# Patient Record
Sex: Female | Born: 1940 | Race: White | Hispanic: No | Marital: Married | Smoking: Former smoker
Health system: Southern US, Community
[De-identification: ages and names within clinical notes are randomized; demographics above are authoritative.]

## PROBLEM LIST (undated history)

## (undated) DIAGNOSIS — H3530 Unspecified macular degeneration: Secondary | ICD-10-CM

## (undated) DIAGNOSIS — Z923 Personal history of irradiation: Secondary | ICD-10-CM

## (undated) DIAGNOSIS — C7931 Secondary malignant neoplasm of brain: Secondary | ICD-10-CM

## (undated) DIAGNOSIS — I10 Essential (primary) hypertension: Secondary | ICD-10-CM

## (undated) DIAGNOSIS — C649 Malignant neoplasm of unspecified kidney, except renal pelvis: Secondary | ICD-10-CM

## (undated) DIAGNOSIS — J45909 Unspecified asthma, uncomplicated: Secondary | ICD-10-CM

## (undated) HISTORY — DX: Personal history of irradiation: Z92.3

## (undated) HISTORY — DX: Malignant neoplasm of unspecified kidney, except renal pelvis: C64.9

## (undated) HISTORY — DX: Secondary malignant neoplasm of brain: C79.31

---

## 2003-05-02 ENCOUNTER — Ambulatory Visit (HOSPITAL_COMMUNITY): Admission: RE | Admit: 2003-05-02 | Discharge: 2003-05-02 | Payer: Self-pay | Admitting: *Deleted

## 2003-11-05 ENCOUNTER — Ambulatory Visit (HOSPITAL_COMMUNITY): Admission: RE | Admit: 2003-11-05 | Discharge: 2003-11-05 | Payer: Self-pay | Admitting: Ophthalmology

## 2003-11-12 ENCOUNTER — Ambulatory Visit (HOSPITAL_COMMUNITY): Admission: RE | Admit: 2003-11-12 | Discharge: 2003-11-12 | Payer: Self-pay | Admitting: Ophthalmology

## 2010-11-25 ENCOUNTER — Encounter: Payer: Self-pay | Admitting: Gastroenterology

## 2012-06-05 ENCOUNTER — Encounter (HOSPITAL_COMMUNITY): Payer: Self-pay | Admitting: *Deleted

## 2012-06-05 ENCOUNTER — Emergency Department (HOSPITAL_COMMUNITY): Payer: Medicare Other

## 2012-06-05 ENCOUNTER — Inpatient Hospital Stay (HOSPITAL_COMMUNITY)
Admission: EM | Admit: 2012-06-05 | Discharge: 2012-06-11 | DRG: 025 | Disposition: A | Discharge status: Home Health | Payer: Medicare Other | Attending: Internal Medicine | Admitting: Internal Medicine

## 2012-06-05 DIAGNOSIS — H3530 Unspecified macular degeneration: Secondary | ICD-10-CM | POA: Diagnosis present

## 2012-06-05 DIAGNOSIS — R262 Difficulty in walking, not elsewhere classified: Secondary | ICD-10-CM | POA: Diagnosis present

## 2012-06-05 DIAGNOSIS — I10 Essential (primary) hypertension: Secondary | ICD-10-CM | POA: Diagnosis present

## 2012-06-05 DIAGNOSIS — N2889 Other specified disorders of kidney and ureter: Secondary | ICD-10-CM | POA: Diagnosis present

## 2012-06-05 DIAGNOSIS — G936 Cerebral edema: Secondary | ICD-10-CM | POA: Diagnosis present

## 2012-06-05 DIAGNOSIS — C7800 Secondary malignant neoplasm of unspecified lung: Secondary | ICD-10-CM | POA: Diagnosis present

## 2012-06-05 DIAGNOSIS — R4789 Other speech disturbances: Secondary | ICD-10-CM | POA: Diagnosis present

## 2012-06-05 DIAGNOSIS — C7970 Secondary malignant neoplasm of unspecified adrenal gland: Secondary | ICD-10-CM | POA: Diagnosis present

## 2012-06-05 DIAGNOSIS — F17200 Nicotine dependence, unspecified, uncomplicated: Secondary | ICD-10-CM | POA: Diagnosis present

## 2012-06-05 DIAGNOSIS — R030 Elevated blood-pressure reading, without diagnosis of hypertension: Secondary | ICD-10-CM

## 2012-06-05 DIAGNOSIS — C7931 Secondary malignant neoplasm of brain: Principal | ICD-10-CM | POA: Diagnosis present

## 2012-06-05 DIAGNOSIS — C649 Malignant neoplasm of unspecified kidney, except renal pelvis: Secondary | ICD-10-CM | POA: Diagnosis present

## 2012-06-05 DIAGNOSIS — C799 Secondary malignant neoplasm of unspecified site: Secondary | ICD-10-CM | POA: Diagnosis present

## 2012-06-05 DIAGNOSIS — D496 Neoplasm of unspecified behavior of brain: Secondary | ICD-10-CM

## 2012-06-05 HISTORY — DX: Unspecified macular degeneration: H35.30

## 2012-06-05 LAB — POCT I-STAT, CHEM 8
Calcium, Ion: 1.12 mmol/L — ABNORMAL LOW (ref 1.13–1.30)
Chloride: 108 mEq/L (ref 96–112)
HCT: 44 % (ref 36.0–46.0)
Potassium: 3.9 mEq/L (ref 3.5–5.1)
Sodium: 143 mEq/L (ref 135–145)
TCO2: 27 mmol/L (ref 0–100)

## 2012-06-05 LAB — CBC WITH DIFFERENTIAL/PLATELET
Basophils Relative: 0 % (ref 0–1)
Basophils Relative: 0 % (ref 0–1)
Eosinophils Relative: 2 % (ref 0–5)
HCT: 43.3 % (ref 36.0–46.0)
Hemoglobin: 14.6 g/dL (ref 12.0–15.0)
Lymphocytes Relative: 33 % (ref 12–46)
Lymphs Abs: 2 10*3/uL (ref 0.7–4.0)
Lymphs Abs: 2.4 10*3/uL (ref 0.7–4.0)
MCH: 29.6 pg (ref 26.0–34.0)
MCHC: 33.7 g/dL (ref 30.0–36.0)
MCHC: 33.6 g/dL (ref 30.0–36.0)
MCV: 87.7 fL (ref 78.0–100.0)
Monocytes Absolute: 0.6 10*3/uL (ref 0.1–1.0)
Monocytes Relative: 8 % (ref 3–12)
Monocytes Relative: 9 % (ref 3–12)
Neutro Abs: 4.6 10*3/uL (ref 1.7–7.7)
Neutro Abs: 4 10*3/uL (ref 1.7–7.7)
Neutrophils Relative %: 62 % (ref 43–77)
Platelets: 236 10*3/uL (ref 150–400)
RBC: 4.94 MIL/uL (ref 3.87–5.11)
RDW: 13.3 % (ref 11.5–15.5)

## 2012-06-05 LAB — COMPREHENSIVE METABOLIC PANEL
ALT: 10 U/L (ref 0–35)
AST: 15 U/L (ref 0–37)
Calcium: 9.4 mg/dL (ref 8.4–10.5)
Glucose, Bld: 102 mg/dL — ABNORMAL HIGH (ref 70–99)
Sodium: 139 mEq/L (ref 135–145)
Total Bilirubin: 0.3 mg/dL (ref 0.3–1.2)
Total Protein: 7.7 g/dL (ref 6.0–8.3)

## 2012-06-05 LAB — TROPONIN I: Troponin I: <0.3 ng/mL (ref <0.30)

## 2012-06-05 MED ORDER — HEPARIN SODIUM (PORCINE) 5000 UNIT/ML IJ SOLN
5000.0000 [IU] | Freq: Three times a day (TID) | INTRAMUSCULAR | Status: DC
  Filled 2012-06-05 (×2): qty 1

## 2012-06-05 MED ORDER — DEXAMETHASONE SODIUM PHOSPHATE 10 MG/ML IJ SOLN
10.0000 mg | Freq: Four times a day (QID) | INTRAMUSCULAR | Status: DC
  Administered 2012-06-06 – 2012-06-07 (×7): 10 mg via INTRAVENOUS
  Filled 2012-06-05 (×10): qty 1

## 2012-06-05 MED ORDER — GADOBENATE DIMEGLUMINE 529 MG/ML IV SOLN
13.0000 mL | Freq: Once | INTRAVENOUS | Status: AC | PRN
  Administered 2012-06-05: 13 mL via INTRAVENOUS

## 2012-06-05 MED ORDER — ONDANSETRON HCL 4 MG/2ML IJ SOLN: 4.0000 mg | Freq: Three times a day (TID) | INTRAMUSCULAR | Status: AC | PRN

## 2012-06-05 MED ORDER — DEXAMETHASONE SODIUM PHOSPHATE 10 MG/ML IJ SOLN
10.0000 mg | Freq: Once | INTRAMUSCULAR | Status: AC
  Administered 2012-06-05: 10 mg via INTRAVENOUS
  Filled 2012-06-05: qty 1

## 2012-06-05 MED ORDER — LABETALOL HCL 5 MG/ML IV SOLN: 5.0000 mg | INTRAVENOUS | Status: DC | PRN

## 2012-06-05 NOTE — ED Notes (Signed)
Pt's family denies hearing slurred speech at this moment, reports now that they think about it, it has been more intermittent.
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
## 2012-06-05 NOTE — ED Notes (Signed)
The pt has had some lower extremity swelling  Intermittently with some difficulty walking since January.  According to her son who is with her the pt has also had some slurred speech in the past 7 days.  At present her speech appears to be  clear

## 2012-06-05 NOTE — ED Notes (Signed)
Pt back from radiology 
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
## 2012-06-05 NOTE — ED Notes (Signed)
Pt's left leg slightly swollen, warm to touch and red around ankle. Pt denies pain to left leg/ankle/foot.

## 2012-06-05 NOTE — ED Notes (Signed)
Pt in CT. Called to ask them to bring her to E7 when finished. Family brought to room.
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
## 2012-06-05 NOTE — ED Notes (Signed)
Pt returned from radiology.

## 2012-06-06 ENCOUNTER — Encounter (HOSPITAL_COMMUNITY): Payer: Self-pay

## 2012-06-06 ENCOUNTER — Inpatient Hospital Stay (HOSPITAL_COMMUNITY): Payer: Medicare Other

## 2012-06-06 ENCOUNTER — Other Ambulatory Visit: Payer: Self-pay | Admitting: Neurological Surgery

## 2012-06-06 DIAGNOSIS — M7989 Other specified soft tissue disorders: Secondary | ICD-10-CM

## 2012-06-06 LAB — CBC
HCT: 44.4 % (ref 36.0–46.0)
MCV: 87.2 fL (ref 78.0–100.0)
RDW: 13.2 % (ref 11.5–15.5)
WBC: 3.9 10*3/uL — ABNORMAL LOW (ref 4.0–10.5)

## 2012-06-06 LAB — BASIC METABOLIC PANEL
BUN: 13 mg/dL (ref 6–23)
Chloride: 103 mEq/L (ref 96–112)
Creatinine, Ser: 0.5 mg/dL (ref 0.50–1.10)
GFR calc Af Amer: >90 mL/min (ref >90)
Glucose, Bld: 171 mg/dL — ABNORMAL HIGH (ref 70–99)

## 2012-06-06 LAB — GLUCOSE, CAPILLARY

## 2012-06-06 MED ORDER — ZOLPIDEM TARTRATE 5 MG PO TABS: 5.0000 mg | ORAL_TABLET | Freq: Every evening | ORAL | Status: DC | PRN

## 2012-06-06 MED ORDER — WHITE PETROLATUM GEL
Status: AC
  Administered 2012-06-06: 13:00:00
  Filled 2012-06-06: qty 5

## 2012-06-06 MED ORDER — IOHEXOL 300 MG/ML  SOLN
100.0000 mL | Freq: Once | INTRAMUSCULAR | Status: AC | PRN
  Administered 2012-06-06: 100 mL via INTRAVENOUS

## 2012-06-06 MED ORDER — IOHEXOL 300 MG/ML  SOLN: 25.0000 mL | INTRAMUSCULAR | Status: AC

## 2012-06-06 MED ORDER — MORPHINE SULFATE 4 MG/ML IJ SOLN
4.0000 mg | INTRAMUSCULAR | Status: DC | PRN
  Administered 2012-06-06 – 2012-06-07 (×2): 4 mg via INTRAVENOUS
  Filled 2012-06-06 (×2): qty 1

## 2012-06-06 MED ORDER — IOHEXOL 300 MG/ML  SOLN
25.0000 mL | INTRAMUSCULAR | Status: AC
  Administered 2012-06-06 (×2): 25 mL via ORAL
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
## 2012-06-06 NOTE — Progress Notes (Signed)
Utilization review completed. Charlotta Lapaglia, RN, BSN. 

## 2012-06-06 NOTE — Progress Notes (Signed)
Patient ID: Sue Ray, female   DOB: [birth year], 72 y.o.   MRN: [id number] Scans are still pending. Med other members of the patien. Hopeful for surgery tomorrow afternoon about 4 PM.t's family

## 2012-06-06 NOTE — Progress Notes (Signed)
Patient ID: Sue Ray, female   DOB: 1940-11-19, 72 y.o.   MRN: [id number] Patient is feeling better on steroids.  Physical exam shows no evidence of a drift. Family all remarks the patient's speech is significantly improved.  I reviewed the CT scan of the abdomen and it notes a large right renal mass. This is likely the primary and is consistent with the vascularity of the lesion seen in the brain on CT scan. Patient may also have some small pulmonary nodules. I would await the official report of the CT of the abdomen.  I discussed the findings with the patient and her family and answer questions as best possible and noted that the patient will need oncology consultation for further treatment of the renal primary. Nonetheless I feel that surgical resection of the singular met in her brain is important is her next and treatment. We'll proceed with surgery tomorrow afternoon.

## 2012-06-06 NOTE — Clinical Social Work Psychosocial (Signed)
Clinical Social Work Department BRIEF PSYCHOSOCIAL ASSESSMENT 06/06/2012  Patient:  Sue Ray, Sue Ray     Account Number:  [id number]     Admit date:  06/05/2012  Clinical Social Worker:  Peggyann Shoals  Date/Time:  06/06/2012 04:33 PM  Referred by:  Physician  Date Referred:  06/06/2012 Referred for  Advanced Directives   Other Referral:   Interview type:  Family Other interview type:    PSYCHOSOCIAL DATA Living Status:  ALONE Admitted from facility:   Level of care:   Primary support name:  Rosalyn Charters Primary support relationship to patient:  CHILD, ADULT Degree of support available:   Supportive.    CURRENT CONCERNS Current Concerns  Post-Acute Placement   Other Concerns:    SOCIAL WORK ASSESSMENT / PLAN CSW met with pt to address consult. CSW introduced herself and explained role of social work. CSW provided information for Advanced Directives. Pt shared that would read it over and discuss it with her family. CSW will follow up on Advanced Directives.    Per chart review, pt is for surgery tomorrow. PT/OT evals are pending for discharge recommendations. CSW will continue to follow.   Assessment/plan status:  Psychosocial Support/Ongoing Assessment of Needs Other assessment/ plan:   Information/referral to community resources:   Designer, industrial/product.    PATIENT'S/FAMILY'S RESPONSE TO PLAN OF CARE: Pt was pleasant and would like to review Advanced Directives.   Dede Query, MSW, LCSW [phone]

## 2012-06-06 NOTE — Progress Notes (Signed)
VASCULAR LAB PRELIMINARY  PRELIMINARY  PRELIMINARY  PRELIMINARY  Left lower extremity venous Doppler completed.    Preliminary report:  There is no DVT or SVT noted in the left lower extremity.  Yordin Rhoda, RVT 06/06/2012, 9:17 AM

## 2012-06-06 NOTE — Progress Notes (Signed)
Radiology called to request that all of patient's CT's happen tomorrow as they only do the stealth protocols on day shift during the week, and if patient were to have Chest and Abd CT tonight they would have to wait 24 hours to give more contrast. NP, Claiborne Billings notified and he stated that would be fine. Will continue to monitor.

## 2012-06-07 ENCOUNTER — Encounter (HOSPITAL_COMMUNITY): Payer: Self-pay | Admitting: Anesthesiology

## 2012-06-07 ENCOUNTER — Inpatient Hospital Stay (HOSPITAL_COMMUNITY): Payer: Medicare Other | Admitting: Anesthesiology

## 2012-06-07 ENCOUNTER — Encounter (HOSPITAL_COMMUNITY)
Admission: EM | Disposition: A | Discharge status: Home Health | Payer: Self-pay | Source: Home / Self Care | Attending: Internal Medicine

## 2012-06-07 DIAGNOSIS — C799 Secondary malignant neoplasm of unspecified site: Secondary | ICD-10-CM | POA: Diagnosis present

## 2012-06-07 DIAGNOSIS — N2889 Other specified disorders of kidney and ureter: Secondary | ICD-10-CM | POA: Diagnosis present

## 2012-06-07 HISTORY — PX: CRANIOTOMY: SHX93

## 2012-06-07 LAB — SURGICAL PCR SCREEN: MRSA, PCR: NEGATIVE

## 2012-06-07 LAB — GLUCOSE, CAPILLARY: Glucose-Capillary: 151 mg/dL — ABNORMAL HIGH (ref 70–99)

## 2012-06-07 SURGERY — CRANIOTOMY TUMOR EXCISION
Anesthesia: General | Site: Head | Laterality: Right | Wound class: Clean

## 2012-06-07 MED ORDER — DEXAMETHASONE SODIUM PHOSPHATE 10 MG/ML IJ SOLN
INTRAMUSCULAR | Status: DC | PRN
  Administered 2012-06-07: 10 mg via INTRAVENOUS

## 2012-06-07 MED ORDER — LABETALOL HCL 5 MG/ML IV SOLN: 10.0000 mg | INTRAVENOUS | Status: DC | PRN

## 2012-06-07 MED ORDER — PROMETHAZINE HCL 25 MG PO TABS: 12.5000 mg | ORAL_TABLET | ORAL | Status: DC | PRN

## 2012-06-07 MED ORDER — HYDROCODONE-ACETAMINOPHEN 5-325 MG PO TABS: 1.0000 | ORAL_TABLET | ORAL | Status: DC | PRN

## 2012-06-07 MED ORDER — OXYCODONE HCL 5 MG PO TABS: 5.0000 mg | ORAL_TABLET | Freq: Once | ORAL | Status: AC | PRN

## 2012-06-07 MED ORDER — OXYCODONE HCL 5 MG/5ML PO SOLN
5.0000 mg | Freq: Once | ORAL | Status: AC | PRN
Start: 2012-06-07 — End: 2012-06-07

## 2012-06-07 MED ORDER — BUPIVACAINE HCL (PF) 0.5 % IJ SOLN
INTRAMUSCULAR | Status: DC | PRN
  Administered 2012-06-07: 8 mL

## 2012-06-07 MED ORDER — LIDOCAINE-EPINEPHRINE 1 %-1:100000 IJ SOLN
INTRAMUSCULAR | Status: DC | PRN
  Administered 2012-06-07: 8 mL

## 2012-06-07 MED ORDER — MIDAZOLAM HCL 5 MG/5ML IJ SOLN
INTRAMUSCULAR | Status: DC | PRN
  Administered 2012-06-07: 1 mg via INTRAVENOUS

## 2012-06-07 MED ORDER — ROCURONIUM BROMIDE 100 MG/10ML IV SOLN
INTRAVENOUS | Status: DC | PRN
  Administered 2012-06-07: 20 mg via INTRAVENOUS
  Administered 2012-06-07: 50 mg via INTRAVENOUS

## 2012-06-07 MED ORDER — SODIUM CHLORIDE 0.9 % IR SOLN
Status: DC | PRN
  Administered 2012-06-07: 17:00:00

## 2012-06-07 MED ORDER — GLYCOPYRROLATE 0.2 MG/ML IJ SOLN
INTRAMUSCULAR | Status: DC | PRN
  Administered 2012-06-07: 0.6 mg via INTRAVENOUS

## 2012-06-07 MED ORDER — HYDROMORPHONE HCL PF 1 MG/ML IJ SOLN
0.2500 mg | INTRAMUSCULAR | Status: DC | PRN
  Administered 2012-06-07 (×2): 0.5 mg via INTRAVENOUS

## 2012-06-07 MED ORDER — ONDANSETRON HCL 4 MG PO TABS: 4.0000 mg | ORAL_TABLET | ORAL | Status: DC | PRN

## 2012-06-07 MED ORDER — SODIUM CHLORIDE 0.9 % IV SOLN
500.0000 mg | Freq: Two times a day (BID) | INTRAVENOUS | Status: DC
  Administered 2012-06-07 – 2012-06-11 (×8): 500 mg via INTRAVENOUS
  Filled 2012-06-07 (×11): qty 5

## 2012-06-07 MED ORDER — 0.9 % SODIUM CHLORIDE (POUR BTL) OPTIME
TOPICAL | Status: DC | PRN
  Administered 2012-06-07 (×2): 1000 mL

## 2012-06-07 MED ORDER — VANCOMYCIN HCL 1000 MG IV SOLR
1000.0000 mg | INTRAVENOUS | Status: DC | PRN
  Administered 2012-06-07: 16:00:00 via INTRAVENOUS

## 2012-06-07 MED ORDER — NEOSTIGMINE METHYLSULFATE 1 MG/ML IJ SOLN
INTRAMUSCULAR | Status: DC | PRN
  Administered 2012-06-07: 4 mg via INTRAVENOUS

## 2012-06-07 MED ORDER — MICROFIBRILLAR COLL HEMOSTAT EX PADS
MEDICATED_PAD | CUTANEOUS | Status: DC | PRN
  Administered 2012-06-07: 1 via TOPICAL

## 2012-06-07 MED ORDER — ONDANSETRON HCL 4 MG/2ML IJ SOLN: 4.0000 mg | Freq: Once | INTRAMUSCULAR | Status: AC | PRN

## 2012-06-07 MED ORDER — FENTANYL CITRATE 0.05 MG/ML IJ SOLN
INTRAMUSCULAR | Status: DC | PRN
  Administered 2012-06-07: 200 ug via INTRAVENOUS
  Administered 2012-06-07: 50 ug via INTRAVENOUS

## 2012-06-07 MED ORDER — DEXAMETHASONE SODIUM PHOSPHATE 10 MG/ML IJ SOLN
6.0000 mg | Freq: Four times a day (QID) | INTRAMUSCULAR | Status: AC
  Administered 2012-06-08 (×4): 6 mg via INTRAVENOUS
  Filled 2012-06-07 (×4): qty 0.6

## 2012-06-07 MED ORDER — MEPERIDINE HCL 25 MG/ML IJ SOLN: 6.2500 mg | INTRAMUSCULAR | Status: DC | PRN

## 2012-06-07 MED ORDER — DEXAMETHASONE SODIUM PHOSPHATE 4 MG/ML IJ SOLN
4.0000 mg | Freq: Four times a day (QID) | INTRAMUSCULAR | Status: AC
  Administered 2012-06-09 (×4): 4 mg via INTRAVENOUS
  Filled 2012-06-07 (×5): qty 1

## 2012-06-07 MED ORDER — LIDOCAINE HCL 4 % MT SOLN
OROMUCOSAL | Status: DC | PRN
  Administered 2012-06-07: 4 mL via TOPICAL

## 2012-06-07 MED ORDER — CEFAZOLIN SODIUM-DEXTROSE 2-3 GM-% IV SOLR
INTRAVENOUS | Status: DC | PRN
  Administered 2012-06-07: 2 g via INTRAVENOUS

## 2012-06-07 MED ORDER — THROMBIN 20000 UNITS EX SOLR
CUTANEOUS | Status: DC | PRN
  Administered 2012-06-07: 17:00:00 via TOPICAL

## 2012-06-07 MED ORDER — SODIUM CHLORIDE 0.9 % IV SOLN
INTRAVENOUS | Status: DC | PRN
  Administered 2012-06-07 (×2): via INTRAVENOUS

## 2012-06-07 MED ORDER — DEXAMETHASONE SODIUM PHOSPHATE 4 MG/ML IJ SOLN
4.0000 mg | Freq: Three times a day (TID) | INTRAMUSCULAR | Status: DC
  Administered 2012-06-10 – 2012-06-11 (×4): 4 mg via INTRAVENOUS
  Filled 2012-06-07 (×6): qty 1

## 2012-06-07 MED ORDER — LIDOCAINE HCL (CARDIAC) 20 MG/ML IV SOLN
INTRAVENOUS | Status: DC | PRN
  Administered 2012-06-07: 60 mg via INTRAVENOUS

## 2012-06-07 MED ORDER — POTASSIUM CHLORIDE IN NACL 20-0.9 MEQ/L-% IV SOLN
INTRAVENOUS | Status: DC
  Administered 2012-06-07: 50 mL/h via INTRAVENOUS
  Administered 2012-06-08: 18:00:00 via INTRAVENOUS
  Administered 2012-06-09: 1000 mL via INTRAVENOUS
  Administered 2012-06-10: 15:00:00 via INTRAVENOUS
  Filled 2012-06-07 (×7): qty 1000

## 2012-06-07 MED ORDER — SODIUM CHLORIDE 0.9 % IV SOLN
500.0000 mg | Freq: Two times a day (BID) | INTRAVENOUS | Status: DC
  Administered 2012-06-07: 500 mg via INTRAVENOUS
  Filled 2012-06-07 (×2): qty 5

## 2012-06-07 MED ORDER — PANTOPRAZOLE SODIUM 40 MG IV SOLR
40.0000 mg | Freq: Every day | INTRAVENOUS | Status: DC
  Administered 2012-06-07 – 2012-06-09 (×3): 40 mg via INTRAVENOUS
  Filled 2012-06-07 (×4): qty 40

## 2012-06-07 MED ORDER — PROPOFOL 10 MG/ML IV BOLUS
INTRAVENOUS | Status: DC | PRN
  Administered 2012-06-07: 120 mg via INTRAVENOUS

## 2012-06-07 MED ORDER — BACITRACIN ZINC 500 UNIT/GM EX OINT
TOPICAL_OINTMENT | CUTANEOUS | Status: DC | PRN
  Administered 2012-06-07 (×2): 1 via TOPICAL

## 2012-06-07 MED ORDER — ONDANSETRON HCL 4 MG/2ML IJ SOLN: 4.0000 mg | INTRAMUSCULAR | Status: DC | PRN

## 2012-06-07 MED ORDER — PHENYLEPHRINE HCL 10 MG/ML IJ SOLN
10.0000 mg | INTRAVENOUS | Status: DC | PRN
  Administered 2012-06-07: 10 ug/min via INTRAVENOUS

## 2012-06-07 SURGICAL SUPPLY — 84 items
BAG DECANTER FOR FLEXI CONT (MISCELLANEOUS) ×2 IMPLANT
BALL CTTN LRG ABS STRL LF (GAUZE/BANDAGES/DRESSINGS)
BANDAGE GAUZE ELAST BULKY 4 IN (GAUZE/BANDAGES/DRESSINGS) ×1 IMPLANT
BIT DRILL WIRE PASS 1.3MM (BIT) IMPLANT
BLADE SURG ROTATE 9660 (MISCELLANEOUS) ×6 IMPLANT
BRUSH SCRUB EZ 1% IODOPHOR (MISCELLANEOUS) ×1 IMPLANT
BRUSH SCRUB EZ PLAIN DRY (MISCELLANEOUS) ×2 IMPLANT
BUR ACORN 6.0 PRECISION (BURR) ×2 IMPLANT
BUR ROUTER D-58 CRANI (BURR) IMPLANT
CANISTER SUCTION 2500CC (MISCELLANEOUS) ×3 IMPLANT
CLIP TI MEDIUM 6 (CLIP) IMPLANT
CLOTH BEACON ORANGE TIMEOUT ST (SAFETY) ×2 IMPLANT
CONT SPEC 4OZ CLIKSEAL STRL BL (MISCELLANEOUS) ×3 IMPLANT
CORDS BIPOLAR (ELECTRODE) ×2 IMPLANT
COTTONBALL LRG STERILE PKG (GAUZE/BANDAGES/DRESSINGS) IMPLANT
COVER TABLE BACK 60X90 (DRAPES) ×2 IMPLANT
DECANTER SPIKE VIAL GLASS SM (MISCELLANEOUS) ×2 IMPLANT
DRAIN CHANNEL 10M FLAT 3/4 FLT (DRAIN) IMPLANT
DRAIN SUBARACHNOID (WOUND CARE) IMPLANT
DRAPE LONG LASER MIC (DRAPES) IMPLANT
DRAPE MICROSCOPE LEICA (MISCELLANEOUS) IMPLANT
DRAPE SURG IRRIG POUCH 19X23 (DRAPES) ×1 IMPLANT
DRAPE WARM FLUID 44X44 (DRAPE) ×2 IMPLANT
DRESSING TELFA 8X3 (GAUZE/BANDAGES/DRESSINGS) ×1 IMPLANT
DRILL WIRE PASS 1.3MM (BIT)
DRSG ADAPTIC 3X8 NADH LF (GAUZE/BANDAGES/DRESSINGS) ×1 IMPLANT
DRSG OPSITE 4X5.5 SM (GAUZE/BANDAGES/DRESSINGS) ×2 IMPLANT
DURAPREP 6ML APPLICATOR 50/CS (WOUND CARE) ×2 IMPLANT
ELECT CAUTERY BLADE 6.4 (BLADE) ×2 IMPLANT
ELECT REM PT RETURN 9FT ADLT (ELECTROSURGICAL) ×2
ELECTRODE REM PT RTRN 9FT ADLT (ELECTROSURGICAL) ×1 IMPLANT
EVACUATOR 1/8 PVC DRAIN (DRAIN) IMPLANT
EVACUATOR SILICONE 100CC (DRAIN) IMPLANT
GAUZE SPONGE 4X4 16PLY XRAY LF (GAUZE/BANDAGES/DRESSINGS) IMPLANT
GLOVE BIO SURGEON STRL SZ7.5 (GLOVE) IMPLANT
GLOVE BIOGEL PI IND STRL 7.5 (GLOVE) IMPLANT
GLOVE BIOGEL PI IND STRL 8.5 (GLOVE) ×1 IMPLANT
GLOVE BIOGEL PI INDICATOR 7.5 (GLOVE) ×2
GLOVE BIOGEL PI INDICATOR 8.5 (GLOVE) ×1
GLOVE ECLIPSE 7.5 STRL STRAW (GLOVE) ×3 IMPLANT
GLOVE ECLIPSE 8.5 STRL (GLOVE) ×2 IMPLANT
GLOVE EXAM NITRILE LRG STRL (GLOVE) IMPLANT
GLOVE EXAM NITRILE MD LF STRL (GLOVE) ×1 IMPLANT
GLOVE EXAM NITRILE XL STR (GLOVE) IMPLANT
GLOVE EXAM NITRILE XS STR PU (GLOVE) IMPLANT
GOWN BRE IMP SLV AUR LG STRL (GOWN DISPOSABLE) IMPLANT
GOWN BRE IMP SLV AUR XL STRL (GOWN DISPOSABLE) ×4 IMPLANT
GOWN STRL REIN 2XL LVL4 (GOWN DISPOSABLE) ×2 IMPLANT
HEMOSTAT SURGICEL 2X14 (HEMOSTASIS) ×1 IMPLANT
HOOK DURA (MISCELLANEOUS) ×2 IMPLANT
KIT BASIN OR (CUSTOM PROCEDURE TRAY) ×2 IMPLANT
KIT ROOM TURNOVER OR (KITS) ×2 IMPLANT
MARKER SPHERE PSV REFLC NDI (MISCELLANEOUS) ×2 IMPLANT
NEEDLE HYPO 22GX1.5 SAFETY (NEEDLE) ×2 IMPLANT
NS IRRIG 1000ML POUR BTL (IV SOLUTION) ×3 IMPLANT
PACK CRANIOTOMY (CUSTOM PROCEDURE TRAY) ×2 IMPLANT
PAD ARMBOARD 7.5X6 YLW CONV (MISCELLANEOUS) ×6 IMPLANT
PATTIES SURGICAL .25X.25 (GAUZE/BANDAGES/DRESSINGS) IMPLANT
PATTIES SURGICAL .5 X.5 (GAUZE/BANDAGES/DRESSINGS) IMPLANT
PATTIES SURGICAL .5 X3 (DISPOSABLE) IMPLANT
PATTIES SURGICAL 1/4 X 3 (GAUZE/BANDAGES/DRESSINGS) IMPLANT
PATTIES SURGICAL 1X1 (DISPOSABLE) IMPLANT
PATTIES SURGICAL 3 X3 (GAUZE/BANDAGES/DRESSINGS)
PATTIES SURGICAL 3X3 (GAUZE/BANDAGES/DRESSINGS) IMPLANT
PIN MAYFIELD SKULL DISP (PIN) ×1 IMPLANT
PLATE 1.5  2HOLE LNG NEURO (Plate) ×2 IMPLANT
PLATE 1.5 2HOLE LNG NEURO (Plate) IMPLANT
PLATE 1.5/0.5 13MM BURR HOLE (Plate) ×1 IMPLANT
SCREW SELF DRILL HT 1.5/4MM (Screw) ×8 IMPLANT
SPONGE GAUZE 4X4 12PLY (GAUZE/BANDAGES/DRESSINGS) IMPLANT
SPONGE NEURO XRAY DETECT 1X3 (DISPOSABLE) IMPLANT
SPONGE SURGIFOAM ABS GEL 100 (HEMOSTASIS) IMPLANT
STAPLER SKIN PROX WIDE 3.9 (STAPLE) ×2 IMPLANT
SUT ETHILON 3 0 FSL (SUTURE) IMPLANT
SUT NURALON 4 0 TR CR/8 (SUTURE) ×4 IMPLANT
SUT VIC AB 2-0 CP2 18 (SUTURE) ×4 IMPLANT
SYR 20ML ECCENTRIC (SYRINGE) ×2 IMPLANT
SYR CONTROL 10ML LL (SYRINGE) ×3 IMPLANT
TIP SONASTAR STD MISONIX 1.9 (TRAY / TRAY PROCEDURE) IMPLANT
TOWEL OR 17X24 6PK STRL BLUE (TOWEL DISPOSABLE) ×2 IMPLANT
TOWEL OR 17X26 10 PK STRL BLUE (TOWEL DISPOSABLE) ×2 IMPLANT
TRAY FOLEY CATH 14FRSI W/METER (CATHETERS) ×1 IMPLANT
UNDERPAD 30X30 INCONTINENT (UNDERPADS AND DIAPERS) IMPLANT
WATER STERILE IRR 1000ML POUR (IV SOLUTION) ×2 IMPLANT
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
## 2012-06-07 NOTE — Plan of Care (Signed)
Problem: Consults Goal: Diagnosis - Craniotomy Tumor     

## 2012-06-07 NOTE — Op Note (Signed)
Date of surgery: 06/07/2012 Preoperative diagnosis: Right parietal brain mass, new diagnosis, right kidney lesion. Postoperative diagnosis: Right parietal brain mass Procedure: Right parietal craniotomy gross total resection of lesion. Surgeon: Barnett Abu Assistant: Maeola Harman Anesthesia: Gen. endotracheal Indications: Patient is a 72 year old lady who has been experiencing progressive deterioration in function including slurring of speech loss of memory and generalized dysfunction noted by her children. An MRI demonstrated the presence of a right parietal brain mass. Further workup including a CT of the chest abdomen and pelvis demonstrates several small pulmonary nodules but a significant mass on the right kidney.  Procedure: The patient was brought to the operating room supine on a stretcher after the smooth induction of general endotracheal anesthesia she was placed on the table in the supine position with a large bump placed under the right shoulder. The head was turned to the left. The patient had Stealth localization performed using a preoperative plan to localize the site of the craniotomy directly over the portion of the tumor closest to the cortical surface. Once this Stealth localization was performed the right parietal scalp was shaved skin was marked for the appropriate site of the craniotomy and then it was cleansed with alcohol and DuraPrep and draped in a sterile fashion. A linear incision was created from the posterior region of the ear to the vertex of the scalp. The galea and periosteum were stripped from the skull. A singular burr hole was placed close to the vertex and then a craniotome attachment was used the high-speed drill to raise a 5 cm round craniotomy flap in the right parietal region based on the parietal boss. The bone flap was removed. Brisk bleeding from the bony edges was controlled with some Gelfoam soaked thrombin pledgets and then a series of tack up sutures were  placed around the perimetry of the dura. The dura was then opened in a cruciate fashion and 3 localization was performed with Stealth. A small corticectomy was made over the portion of the tumor closest to the surface of the cortex and at approximately 5 mm of depth a hard firm mass was encountered. This was dissected carefully using small pledgets of cottonoid to separate the surrounding glial brain from the tumor. On the superior aspect of the tumor was identified a vascular pedicle. This was cauterized and divided using microscissors and microsurgical technique. Further dissection around the tumor yielded a singular mass of tumor measuring approximately 4 cm in depth and approximately 3 cm in maximal width. This was delivered through the corticectomy and removed. Hemostasis was then obtained from all of glial edges using judicious bipolar cautery and some small pledgets of Gelfoam soaked in thrombin. The cavity was then lined with oxidized cellulose however this was then later irrigated away. Adequate hemostasis in the tumor bed was obtained. The glial surfaces and the pial surfaces were all cauterized. The dura was then reflected into its original position and closed with interrupted 4-0 Nurolon sutures. The bone flap was replaced and secured with a burr hole cover over the burr hole into small straight plates. The galea was then closed with 2 0 Vicryl in interrupted and surgical staples in the scalp. Blood loss was estimated at 100 cc.

## 2012-06-07 NOTE — Progress Notes (Signed)
Patient to OR via bed.

## 2012-06-07 NOTE — Preoperative (Signed)
Beta Blockers   Reason not to administer Beta Blockers:Not Applicable 

## 2012-06-07 NOTE — Anesthesia Procedure Notes (Signed)
Procedure Name: Intubation Date/Time: 06/07/2012 4:30 PM Performed by: Ellin Goodie Pre-anesthesia Checklist: Patient identified, Emergency Drugs available, Suction available, Patient being monitored and Timeout performed Patient Re-evaluated:Patient Re-evaluated prior to inductionOxygen Delivery Method: Circle system utilized Preoxygenation: Pre-oxygenation with 100% oxygen Intubation Type: IV induction Ventilation: Mask ventilation without difficulty Laryngoscope Size: Mac and 3 Grade View: Grade I Tube size: 7.5 mm Number of attempts: 1 Airway Equipment and Method: Stylet and LTA kit utilized Placement Confirmation: ETT inserted through vocal cords under direct vision,  positive ETCO2 and breath sounds checked- equal and bilateral Secured at: 22 cm Tube secured with: Tape Dental Injury: Teeth and Oropharynx as per pre-operative assessment

## 2012-06-07 NOTE — Transfer of Care (Signed)
Immediate Anesthesia Transfer of Care Note  Patient: Sue Ray  Procedure(s) Performed: Procedure(s) with comments: CRANIOTOMY TUMOR EXCISION (Right) - Right Parietal Craniotomy for Tumor with Stealth  Patient Location: PACU  Anesthesia Type:General  Level of Consciousness: sedated  Airway & Oxygen Therapy: Patient connected to face mask  Post-op Assessment: Report given to PACU RN  Post vital signs: stable  Complications: No apparent anesthesia complications

## 2012-06-07 NOTE — Anesthesia Postprocedure Evaluation (Signed)
  Anesthesia Post-op Note  Patient: Sue Ray  Procedure(s) Performed: Procedure(s) with comments: CRANIOTOMY TUMOR EXCISION (Right) - Right Parietal Craniotomy for Tumor with Stealth  Patient Location: PACU  Anesthesia Type:General  Level of Consciousness: awake, lethargic and responds to stimulation  Airway and Oxygen Therapy: Patient Spontanous Breathing and Patient connected to nasal cannula oxygen  Post-op Pain: none  Post-op Assessment: Post-op Vital signs reviewed, Patient's Cardiovascular Status Stable, Respiratory Function Stable, Patent Airway and Pain level controlled  Post-op Vital Signs: stable  Complications: No apparent anesthesia complications

## 2012-06-07 NOTE — Progress Notes (Signed)
Chaplain responded to pt's request for prayer.  Found pt awake and very welcoming. Pt's greatest fear is that she may not make it through the surgery and she does not want to leave her family. Listened empathetically, provided spiritual and emotional support. Prayed with pt and her daughter. Follow up by unit chaplain recommended.  Rutherford Nail Chaplain

## 2012-06-07 NOTE — Anesthesia Preprocedure Evaluation (Addendum)
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  Reproductive/Obstetrics                         Anesthesia Physical Anesthesia Plan  ASA: III  Anesthesia Plan: General   Post-op Pain Management:    Induction: Intravenous  Airway Management Planned: Oral ETT  Additional Equipment: Arterial line  Intra-op Plan:   Post-operative Plan: Extubation in OR  Informed Consent: I have reviewed the patients History and Physical, chart, labs and discussed the procedure including the risks, benefits and alternatives for the proposed anesthesia with the patient or authorized representative who has indicated his/her understanding and acceptance.   Dental advisory given  Plan Discussed with: CRNA and Surgeon  Anesthesia Plan Comments:        Anesthesia Quick Evaluation
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
## 2012-06-07 NOTE — Progress Notes (Signed)
Pt referred by on-call chaplain who prayed with pt this morning. Pt had requested prayer prior to surgery this afternoon. I provided spiritual and emotional support for pt and prayed with pt and her son and daughter. They greatly appreciated the visit and prayer.

## 2012-06-08 DIAGNOSIS — N289 Disorder of kidney and ureter, unspecified: Secondary | ICD-10-CM

## 2012-06-08 DIAGNOSIS — C801 Malignant (primary) neoplasm, unspecified: Secondary | ICD-10-CM

## 2012-06-08 LAB — BASIC METABOLIC PANEL
BUN: 16 mg/dL (ref 6–23)
Calcium: 8.4 mg/dL (ref 8.4–10.5)
Chloride: 108 mEq/L (ref 96–112)
Creatinine, Ser: 0.58 mg/dL (ref 0.50–1.10)
GFR calc Af Amer: >90 mL/min (ref >90)

## 2012-06-08 LAB — CBC
HCT: 39 % (ref 36.0–46.0)
MCHC: 32.3 g/dL (ref 30.0–36.0)
MCV: 88.8 fL (ref 78.0–100.0)
Platelets: 214 10*3/uL (ref 150–400)
RDW: 13.7 % (ref 11.5–15.5)
WBC: 12.2 10*3/uL — ABNORMAL HIGH (ref 4.0–10.5)

## 2012-06-08 MED ORDER — BIOTENE DRY MOUTH MT LIQD
15.0000 mL | Freq: Two times a day (BID) | OROMUCOSAL | Status: DC
  Administered 2012-06-08 – 2012-06-10 (×6): 15 mL via OROMUCOSAL

## 2012-06-08 NOTE — Progress Notes (Signed)
Patient ID: Sue Ray, female   DOB: [birth year], 72 y.o.   MRN: [id number] 1 Day Post-Op Subjective: Patient is status post craniotomy with excision of the lesion yesterday. She appears to be recovering well.   Objective: Vital signs in last 24 hours: Temp:  [96.6 F (35.9 C)-97.9 F (36.6 C)] 97.5 F (36.4 C) (03/26 1158) Pulse Rate:  [48-73] 48 (03/26 1500) Resp:  [10-20] 19 (03/26 1500) BP: (94-149)/(46-97) 149/67 mmHg (03/26 1500) SpO2:  [93 %-100 %] 95 % (03/26 1500) Arterial Line BP: (113-186)/(60-109) 161/79 mmHg (03/26 0900)  Intake/Output from previous day: 03/25 0701 - 03/26 0700 In: 1919.2 [P.O.:360; I.V.:1454.2; IV Piggyback:105] Out: 740 [Urine:690; Blood:50] Intake/Output this shift: Total I/O In: 985 [P.O.:480; I.V.:400; IV Piggyback:105] Out: 725 [Urine:725]  Physical Exam:  Constitutional: Vital signs reviewed. WD WN in NAD     Lab Results:  Recent Labs  06/05/12 1800 06/06/12 0710 06/08/12 0517  HGB 15.0 14.8 12.6  HCT 44.0 44.4 39.0   BMET  Recent Labs  06/06/12 0710 06/08/12 0517  NA 139 141  K 3.7 4.2  CL 103 108  CO2 21 23  GLUCOSE 171* 142*  BUN 13 16  CREATININE 0.50 0.58  CALCIUM 9.5 8.4   No results found for this basename: LABPT, INR,  in the last 72 hours No results found for this basename: LABURIN,  in the last 72 hours Results for orders placed during the hospital encounter of 06/05/12  SURGICAL PCR SCREEN     Status: None   Collection Time    06/06/12 10:58 PM      Result Value Range Status   MRSA, PCR NEGATIVE  NEGATIVE Final   Staphylococcus aureus NEGATIVE  NEGATIVE Final   Comment:            The Xpert SA Assay (FDA     approved for NASAL specimens     in patients over 84 years of age),     is one component of     a comprehensive surveillance     program.  Test performance has     been validated by [employer] for patients greater     than or equal to 3 year old.     It is not intended     to  diagnose infection nor to     guide or monitor treatment.  MRSA PCR SCREENING     Status: None   Collection Time    06/07/12  9:16 PM      Result Value Range Status   MRSA by PCR NEGATIVE  NEGATIVE Final   Comment:            The GeneXpert MRSA Assay (FDA     approved for NASAL specimens     only), is one component of a     comprehensive MRSA colonization     surveillance program. It is not     intended to diagnose MRSA     infection nor to guide or     monitor treatment for     MRSA infections.    Studies/Results: Ct Head W Contrast  06/06/2012  *RADIOLOGY REPORT*  Clinical Data: 72 Year-old female with right parietal brain mass. Preoperative study for stereotactic surgical planning requested.  CT HEAD WITH CONTRAST  Technique:  Contiguous axial images were obtained from the base of the skull through the vertex with intravenous contrast  Contrast:  100 ml Omnipaque-300 In conjunction with CT(s) of  the chest abdomen and pelvis which is(are) reported separately.  Comparison: Brain MRI without and with contrast 06/05/2012.  Findings: Roughly 30 x 24 mm lobulated enhancing mass re-identified in the right parietal lobe with extensive surrounding vasogenic edema involving the posterior right hemisphere and much of the right temporal lobe.  Mass effect on the right lateral ventricle. No ventriculomegaly and stable ventricle size.  Leftward midline shift of 7 mm is stable.  Basilar cisterns remain patent.  No suspicious osseous lesion identified.  Stable orbit and scalp soft tissues. Calcified atherosclerosis at the skull base.  IMPRESSION: 1.  Study performed for stereotactic surgical planning.  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 2.  Stable cerebral edema and associated mass effect.   Original Report Authenticated By: Erskine Speed, M.D.    Ct Chest W Contrast  06/06/2012  *RADIOLOGY REPORT*  Clinical Data:  Brain tumor, evaluate  for primary malignancy.  CT CHEST, ABDOMEN AND PELVIS WITH CONTRAST  Technique:  Multidetector CT imaging of the chest, abdomen and pelvis was performed following the standard protocol during bolus administration of intravenous contrast.  Contrast: OMNIPAQUE IOHEXOL 300 MG/ML  SOLN  Comparison:  CT head 06/05/2012 and MR brain 06/05/2012  CT CHEST  Findings:  There is ectasia of the thoracic aorta without aneurysm. Negative for aortic dissection.  Mild cardiomegaly.  Calcified mediastinal and left hilar lymph nodes are consistent with prior granulomatous disease.  No pathologically enlarged supraclavicular, mediastinal, hilar, or axillary lymph nodes are identified.  There is no pleural or pericardial effusion.  Lung windows are slightly degraded by respiratory motion.  There is a peripheral, noncalcified 6-mm nodule in the right middle lobe on image #28 of lung windows.  Just inferior to this is a 5 mm right middle lobe pulmonary nodule on image number 31.  4 mm pulmonary nodule in the right middle lobe on image number 35.  Likely tiny pulmonary nodules in the medial right middle lobe images on 33 and 34.  Approximately 4 mm peripheral nodule in the left upper lobe on image number 10.  Approximately 3 mm pulmonary nodule right lower lobe on image number 32 and 4 mm pulmonary nodule right lower lobe on image number 37.  There is no airspace disease.  The trachea and mainstem bronchi are patent.  There is multilevel degenerative change of the thoracic spine.  No osseous metastatic lesion is identified.  IMPRESSION:  1.  Multiple small bilateral pulmonary nodules.  Findings are suspicious for metastatic pulmonary nodules given the suspected brain metastasis and renal mass (described below).  There are no prior chest CTs for comparison. Pulmonary granulomas can have a similar appearance. 2.  Ectasia of the thoracic aorta.  CT ABDOMEN AND PELVIS  Findings:  There is diffuse fatty infiltration of the liver.  No  focal hepatic lesion is identified.  There is no biliary ductal dilatation.  The spleen is normal in size and enhancement.  There is a large heterogeneous solid mass with internal vascularity involving the mid and lower poles and extending exophytically from the lower pole of the right kidney.  This mass measures approximately 9.1 x 7.8 x 9.1 cm and has imaging features compatible with a renal cell carcinoma.  The upper pole collecting system of the right kidney is dilated, consistent with obstruction by the mass.  The mid to lower pole right renal collecting system is obliterated by the mass. There is prominent vasculature in the perinephric fat  surrounding the mass.  No suspicious mass is identified in the left kidney. There is a small, 4 mm low density lesion in the upper pole of the left kidney that is too small to characterize, but likely a tiny cyst.  There is no hydronephrosis on the left. The left renal collecting system opacifies normally on delayed images.  The proximal right ureter opacifies with contrast on the delayed images.  There is no ureteral dilatation bilaterally.  Urinary bladder appears normal.  A heterogeneous left adrenal gland nodule measures 2.2 x 1.8 cm, extending inferiorly from the lateral limb.  There are no prior imaging studies for comparison.  Metastatic involvement of the left adrenal gland cannot be excluded.  The right adrenal gland, pancreas, common bile duct, and gallbladder appear within normal limits.  The stomach is nondistended and unremarkable.  Small bowel loops and colon are normal in caliber.  The abdominal aorta contains scattered atherosclerotic disease and is mildly ectatic.  No evidence for aneurysm.  The right renal vein appears patent.  No discrete lymphadenopathy is identified in the abdomen or pelvis.  There is facet joint degenerative change of the lower lumbar spine. No fracture or suspicious osseous lesion is identified.  IMPRESSION:  1.  Large solid mass  involving the mid to lower pole of the right kidney has imaging features compatible with a primary renal cell carcinoma (9.1 cm greatest diameter). This mass results in hydronephrosis of the upper pole of the right kidney 2.  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 3.  Multiple small bilateral pulmonary nodules are suspicious for pulmonary metastases given the presence of the right renal cell carcinoma.  Pulmonary granulomas can have a similar imaging appearance.   Original Report Authenticated By: Britta Mccreedy, M.D.    Ct Abdomen Pelvis W Contrast  06/06/2012  *RADIOLOGY REPORT*  Clinical Data:  Brain tumor, evaluate for primary malignancy.  CT CHEST, ABDOMEN AND PELVIS WITH CONTRAST  Technique:  Multidetector CT imaging of the chest, abdomen and pelvis was performed following the standard protocol during bolus administration of intravenous contrast.  Contrast: OMNIPAQUE IOHEXOL 300 MG/ML  SOLN  Comparison:  CT head 06/05/2012 and MR brain 06/05/2012  CT CHEST  Findings:  There is ectasia of the thoracic aorta without aneurysm. Negative for aortic dissection.  Mild cardiomegaly.  Calcified mediastinal and left hilar lymph nodes are consistent with prior granulomatous disease.  No pathologically enlarged supraclavicular, mediastinal, hilar, or axillary lymph nodes are identified.  There is no pleural or pericardial effusion.  Lung windows are slightly degraded by respiratory motion.  There is a peripheral, noncalcified 6-mm nodule in the right middle lobe on image #28 of lung windows.  Just inferior to this is a 5 mm right middle lobe pulmonary nodule on image number 31.  4 mm pulmonary nodule in the right middle lobe on image number 35.  Likely tiny pulmonary nodules in the medial right middle lobe images on 33 and 34.  Approximately 4 mm peripheral nodule in the left upper lobe on image number 10.  Approximately 3 mm pulmonary nodule right lower lobe on image number 32 and 4 mm pulmonary  nodule right lower lobe on image number 37.  There is no airspace disease.  The trachea and mainstem bronchi are patent.  There is multilevel degenerative change of the thoracic spine.  No osseous metastatic lesion is identified.  IMPRESSION:  1.  Multiple small bilateral pulmonary nodules.  Findings are suspicious for metastatic pulmonary nodules given  the suspected brain metastasis and renal mass (described below).  There are no prior chest CTs for comparison. Pulmonary granulomas can have a similar appearance. 2.  Ectasia of the thoracic aorta.  CT ABDOMEN AND PELVIS  Findings:  There is diffuse fatty infiltration of the liver.  No focal hepatic lesion is identified.  There is no biliary ductal dilatation.  The spleen is normal in size and enhancement.  There is a large heterogeneous solid mass with internal vascularity involving the mid and lower poles and extending exophytically from the lower pole of the right kidney.  This mass measures approximately 9.1 x 7.8 x 9.1 cm and has imaging features compatible with a renal cell carcinoma.  The upper pole collecting system of the right kidney is dilated, consistent with obstruction by the mass.  The mid to lower pole right renal collecting system is obliterated by the mass. There is prominent vasculature in the perinephric fat surrounding the mass.  No suspicious mass is identified in the left kidney. There is a small, 4 mm low density lesion in the upper pole of the left kidney that is too small to characterize, but likely a tiny cyst.  There is no hydronephrosis on the left. The left renal collecting system opacifies normally on delayed images.  The proximal right ureter opacifies with contrast on the delayed images.  There is no ureteral dilatation bilaterally.  Urinary bladder appears normal.  A heterogeneous left adrenal gland nodule measures 2.2 x 1.8 cm, extending inferiorly from the lateral limb.  There are no prior imaging studies for comparison.   Metastatic involvement of the left adrenal gland cannot be excluded.  The right adrenal gland, pancreas, common bile duct, and gallbladder appear within normal limits.  The stomach is nondistended and unremarkable.  Small bowel loops and colon are normal in caliber.  The abdominal aorta contains scattered atherosclerotic disease and is mildly ectatic.  No evidence for aneurysm.  The right renal vein appears patent.  No discrete lymphadenopathy is identified in the abdomen or pelvis.  There is facet joint degenerative change of the lower lumbar spine. No fracture or suspicious osseous lesion is identified.  IMPRESSION:  1.  Large solid mass involving the mid to lower pole of the right kidney has imaging features compatible with a primary renal cell carcinoma (9.1 cm greatest diameter). This mass results in hydronephrosis of the upper pole of the right kidney 2.  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 3.  Multiple small bilateral pulmonary nodules are suspicious for pulmonary metastases given the presence of the right renal cell carcinoma.  Pulmonary granulomas can have a similar imaging appearance.   Original Report Authenticated By: Britta Mccreedy, M.D.     Assessment/Plan:   Presumptive metastatic renal cell carcinoma. We'll await pathology of the brain lesion as well as medical oncology input. Again daily to/side a reductive nephrectomy rarely done in the face of brain metastases with our exceptions pending further assessment of her entire clinical situation. Certainly no urgent need for any surgical intervention from a urologic standpoint.   LOS: 3 days   Joclynn Lumb S 06/08/2012, 3:39 PM

## 2012-06-08 NOTE — Progress Notes (Addendum)
Having trouble with vital signs transferring into my Epic Doc flow sheet.  Mechanical Ventilation capnography still charting every minute from anesthesia.  Operating room and E-link support help contacted and currently working on issue. Jacqulynn Cadet

## 2012-06-08 NOTE — Progress Notes (Signed)
Chaplain wanted to follow-up with pt since I had prayed with pt and family yesterday prior to surgery. Pt was sitting in chair and smiling, and pt's son was present. Pt said tumor was successfully removed and she is feeling fine. I rejoiced with them at this good news and said I would see them again on 4N where pt expects to be going later today.

## 2012-06-08 NOTE — Progress Notes (Signed)
[facility] PROGRESS NOTE  Assessment/Plan: Brain tumor - MRI 3.23.2014: as below. Midline shift, right patietal mass and edema. Now s/p right parietal craniotomy with mass resection. Awaiting pathology. - CT chest abdomen and pelvis as below. Urology is following for renal mass. - neurosurgery, rec steroids. - She is doing well postoperatively, transfer to the floor tomorrow morning per neurosurgery recommendations.  Blood pressure elevated - We'll continue to monitor, currently on no medications.   Code Status: full Family Communication: family at bedside  Disposition Plan: inpatient.  Consultants:  Neuro surgery  Procedures: - MRI 3.23.2014: Solitary mass lesion right parietal lobe with a large amount of  surrounding edema and mass effect. - ct abdomen: 3.23.2014: Multiple small bilateral pulmonary nodules, Large solid mass involving the mid to lower pole of the right kidney has imaging features compatible with a primary renal cell carcinoma (9.1 cm greatest diameter)  Antibiotics:  None  HPI/Subjective: - feels well this morning  Objective: Filed Vitals:   06/08/12 0400 06/08/12 0431 06/08/12 0500 06/08/12 0600  BP: 126/50  98/69 94/54  Pulse: 56  56 60  Temp:  97.4 F (36.3 C)    TempSrc:  Oral    Resp: 13  15 17   Height:      Weight:      SpO2: 96%  97% 93%    Intake/Output Summary (Last 24 hours) at 06/08/12 0807 Last data filed at 06/08/12 0600  Gross per 24 hour  Intake 1869.17 ml  Output    740 ml  Net 1129.17 ml   Filed Weights   06/05/12 2115  Weight: 59.92 kg (132 lb 1.6 oz)    Exam:  General: Alert, awake, oriented x3, in no acute distress.  HEENT: No bruits, no goiter.  Heart: Regular rate and rhythm, without murmurs, rubs, gallops.  Lungs: Good air movement, clear to auscultation. Abdomen: Soft, nontender, nondistended, positive bowel sounds.   Data Reviewed: Basic Metabolic Panel:  Recent Labs Lab 06/05/12 1604  06/05/12 1800 06/06/12 0710 06/08/12 0517  NA 139 143 139 141  K 3.8 3.9 3.7 4.2  CL 102 108 103 108  CO2 25  --  21 23  GLUCOSE 102* 101* 171* 142*  BUN 14 16 13 16   CREATININE 0.68 0.70 0.50 0.58  CALCIUM 9.4  --  9.5 8.4   Liver Function Tests:  Recent Labs Lab 06/05/12 1604  AST 15  ALT 10  ALKPHOS 82  BILITOT 0.3  PROT 7.7  ALBUMIN 3.6   CBC:  Recent Labs Lab 06/05/12 1604 06/05/12 1736 06/05/12 1800 06/06/12 0710 06/08/12 0517  WBC 7.3 7.1  --  3.9* 12.2*  NEUTROABS 4.6 4.0  --   --   --   HGB 14.6 14.6 15.0 14.8 12.6  HCT 43.3 43.5 44.0 44.4 39.0  MCV 87.7 88.1  --  87.2 88.8  PLT 249 236  --  257 214   Cardiac Enzymes:  Recent Labs Lab 06/05/12 1604  TROPONINI <0.30   CBG:  Recent Labs Lab 06/06/12 0647 06/07/12 0624 06/07/12 1612 06/07/12 1842  GLUCAP 187* 151* 151* 155*    Recent Results (from the past 240 hour(s))  SURGICAL PCR SCREEN     Status: None   Collection Time    06/06/12 10:58 PM      Result Value Range Status   MRSA, PCR NEGATIVE  NEGATIVE Final   Staphylococcus aureus NEGATIVE  NEGATIVE Final   Comment:  The Xpert SA Assay (FDA     approved for NASAL specimens     in patients over 35 years of age),     is one component of     a comprehensive surveillance     program.  Test performance has     been validated by [employer] for patients greater     than or equal to 17 year old.     It is not intended     to diagnose infection nor to     guide or monitor treatment.  MRSA PCR SCREENING     Status: None   Collection Time    06/07/12  9:16 PM      Result Value Range Status   MRSA by PCR NEGATIVE  NEGATIVE Final   Comment:            The GeneXpert MRSA Assay (FDA     approved for NASAL specimens     only), is one component of a     comprehensive MRSA colonization     surveillance program. It is not     intended to diagnose MRSA     infection nor to guide or     monitor treatment for     MRSA  infections.     Studies: Ct Head W Contrast  06/06/2012  *RADIOLOGY REPORT*  Clinical Data: 72 Year-old female with right parietal brain mass. Preoperative study for stereotactic surgical planning requested.  CT HEAD WITH CONTRAST  Technique:  Contiguous axial images were obtained from the base of the skull through the vertex with intravenous contrast  Contrast:  100 ml Omnipaque-300 In conjunction with CT(s) of the chest abdomen and pelvis which is(are) reported separately.  Comparison: Brain MRI without and with contrast 06/05/2012.  Findings: Roughly 30 x 24 mm lobulated enhancing mass re-identified in the right parietal lobe with extensive surrounding vasogenic edema involving the posterior right hemisphere and much of the right temporal lobe.  Mass effect on the right lateral ventricle. No ventriculomegaly and stable ventricle size.  Leftward midline shift of 7 mm is stable.  Basilar cisterns remain patent.  No suspicious osseous lesion identified.  Stable orbit and scalp soft tissues. Calcified atherosclerosis at the skull base.  IMPRESSION: 1.  Study performed for stereotactic surgical planning.  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 2.  Stable cerebral edema and associated mass effect.   Original Report Authenticated By: Erskine Speed, M.D.    Ct Chest W Contrast  06/06/2012  *RADIOLOGY REPORT*  Clinical Data:  Brain tumor, evaluate for primary malignancy.  CT CHEST, ABDOMEN AND PELVIS WITH CONTRAST  Technique:  Multidetector CT imaging of the chest, abdomen and pelvis was performed following the standard protocol during bolus administration of intravenous contrast.  Contrast: OMNIPAQUE IOHEXOL 300 MG/ML  SOLN  Comparison:  CT head 06/05/2012 and MR brain 06/05/2012  CT CHEST  Findings:  There is ectasia of the thoracic aorta without aneurysm. Negative for aortic dissection.  Mild cardiomegaly.  Calcified mediastinal and left  hilar lymph nodes are consistent with prior granulomatous disease.  No pathologically enlarged supraclavicular, mediastinal, hilar, or axillary lymph nodes are identified.  There is no pleural or pericardial effusion.  Lung windows are slightly degraded by respiratory motion.  There is a peripheral, noncalcified 6-mm nodule in the right middle lobe on image #28 of lung windows.  Just inferior to this is a 5 mm  right middle lobe pulmonary nodule on image number 31.  4 mm pulmonary nodule in the right middle lobe on image number 35.  Likely tiny pulmonary nodules in the medial right middle lobe images on 33 and 34.  Approximately 4 mm peripheral nodule in the left upper lobe on image number 10.  Approximately 3 mm pulmonary nodule right lower lobe on image number 32 and 4 mm pulmonary nodule right lower lobe on image number 37.  There is no airspace disease.  The trachea and mainstem bronchi are patent.  There is multilevel degenerative change of the thoracic spine.  No osseous metastatic lesion is identified.  IMPRESSION:  1.  Multiple small bilateral pulmonary nodules.  Findings are suspicious for metastatic pulmonary nodules given the suspected brain metastasis and renal mass (described below).  There are no prior chest CTs for comparison. Pulmonary granulomas can have a similar appearance. 2.  Ectasia of the thoracic aorta.  CT ABDOMEN AND PELVIS  Findings:  There is diffuse fatty infiltration of the liver.  No focal hepatic lesion is identified.  There is no biliary ductal dilatation.  The spleen is normal in size and enhancement.  There is a large heterogeneous solid mass with internal vascularity involving the mid and lower poles and extending exophytically from the lower pole of the right kidney.  This mass measures approximately 9.1 x 7.8 x 9.1 cm and has imaging features compatible with a renal cell carcinoma.  The upper pole collecting system of the right kidney is dilated, consistent with obstruction by  the mass.  The mid to lower pole right renal collecting system is obliterated by the mass. There is prominent vasculature in the perinephric fat surrounding the mass.  No suspicious mass is identified in the left kidney. There is a small, 4 mm low density lesion in the upper pole of the left kidney that is too small to characterize, but likely a tiny cyst.  There is no hydronephrosis on the left. The left renal collecting system opacifies normally on delayed images.  The proximal right ureter opacifies with contrast on the delayed images.  There is no ureteral dilatation bilaterally.  Urinary bladder appears normal.  A heterogeneous left adrenal gland nodule measures 2.2 x 1.8 cm, extending inferiorly from the lateral limb.  There are no prior imaging studies for comparison.  Metastatic involvement of the left adrenal gland cannot be excluded.  The right adrenal gland, pancreas, common bile duct, and gallbladder appear within normal limits.  The stomach is nondistended and unremarkable.  Small bowel loops and colon are normal in caliber.  The abdominal aorta contains scattered atherosclerotic disease and is mildly ectatic.  No evidence for aneurysm.  The right renal vein appears patent.  No discrete lymphadenopathy is identified in the abdomen or pelvis.  There is facet joint degenerative change of the lower lumbar spine. No fracture or suspicious osseous lesion is identified.  IMPRESSION:  1.  Large solid mass involving the mid to lower pole of the right kidney has imaging features compatible with a primary renal cell carcinoma (9.1 cm greatest diameter). This mass results in hydronephrosis of the upper pole of the right kidney 2.  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 3.  Multiple small bilateral pulmonary nodules are suspicious for pulmonary metastases given the presence of the right renal cell carcinoma.  Pulmonary granulomas can have a similar imaging appearance.   Original Report Authenticated  By: Britta Mccreedy, M.D.    Ct Abdomen Pelvis W  Contrast  06/06/2012  *RADIOLOGY REPORT*  Clinical Data:  Brain tumor, evaluate for primary malignancy.  CT CHEST, ABDOMEN AND PELVIS WITH CONTRAST  Technique:  Multidetector CT imaging of the chest, abdomen and pelvis was performed following the standard protocol during bolus administration of intravenous contrast.  Contrast: OMNIPAQUE IOHEXOL 300 MG/ML  SOLN  Comparison:  CT head 06/05/2012 and MR brain 06/05/2012  CT CHEST  Findings:  There is ectasia of the thoracic aorta without aneurysm. Negative for aortic dissection.  Mild cardiomegaly.  Calcified mediastinal and left hilar lymph nodes are consistent with prior granulomatous disease.  No pathologically enlarged supraclavicular, mediastinal, hilar, or axillary lymph nodes are identified.  There is no pleural or pericardial effusion.  Lung windows are slightly degraded by respiratory motion.  There is a peripheral, noncalcified 6-mm nodule in the right middle lobe on image #28 of lung windows.  Just inferior to this is a 5 mm right middle lobe pulmonary nodule on image number 31.  4 mm pulmonary nodule in the right middle lobe on image number 35.  Likely tiny pulmonary nodules in the medial right middle lobe images on 33 and 34.  Approximately 4 mm peripheral nodule in the left upper lobe on image number 10.  Approximately 3 mm pulmonary nodule right lower lobe on image number 32 and 4 mm pulmonary nodule right lower lobe on image number 37.  There is no airspace disease.  The trachea and mainstem bronchi are patent.  There is multilevel degenerative change of the thoracic spine.  No osseous metastatic lesion is identified.  IMPRESSION:  1.  Multiple small bilateral pulmonary nodules.  Findings are suspicious for metastatic pulmonary nodules given the suspected brain metastasis and renal mass (described below).  There are no prior chest CTs for comparison. Pulmonary granulomas can have a similar  appearance. 2.  Ectasia of the thoracic aorta.  CT ABDOMEN AND PELVIS  Findings:  There is diffuse fatty infiltration of the liver.  No focal hepatic lesion is identified.  There is no biliary ductal dilatation.  The spleen is normal in size and enhancement.  There is a large heterogeneous solid mass with internal vascularity involving the mid and lower poles and extending exophytically from the lower pole of the right kidney.  This mass measures approximately 9.1 x 7.8 x 9.1 cm and has imaging features compatible with a renal cell carcinoma.  The upper pole collecting system of the right kidney is dilated, consistent with obstruction by the mass.  The mid to lower pole right renal collecting system is obliterated by the mass. There is prominent vasculature in the perinephric fat surrounding the mass.  No suspicious mass is identified in the left kidney. There is a small, 4 mm low density lesion in the upper pole of the left kidney that is too small to characterize, but likely a tiny cyst.  There is no hydronephrosis on the left. The left renal collecting system opacifies normally on delayed images.  The proximal right ureter opacifies with contrast on the delayed images.  There is no ureteral dilatation bilaterally.  Urinary bladder appears normal.  A heterogeneous left adrenal gland nodule measures 2.2 x 1.8 cm, extending inferiorly from the lateral limb.  There are no prior imaging studies for comparison.  Metastatic involvement of the left adrenal gland cannot be excluded.  The right adrenal gland, pancreas, common bile duct, and gallbladder appear within normal limits.  The stomach is nondistended and unremarkable.  Small bowel loops and  colon are normal in caliber.  The abdominal aorta contains scattered atherosclerotic disease and is mildly ectatic.  No evidence for aneurysm.  The right renal vein appears patent.  No discrete lymphadenopathy is identified in the abdomen or pelvis.  There is facet joint  degenerative change of the lower lumbar spine. No fracture or suspicious osseous lesion is identified.  IMPRESSION:  1.  Large solid mass involving the mid to lower pole of the right kidney has imaging features compatible with a primary renal cell carcinoma (9.1 cm greatest diameter). This mass results in hydronephrosis of the upper pole of the right kidney 2.  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 3.  Multiple small bilateral pulmonary nodules are suspicious for pulmonary metastases given the presence of the right renal cell carcinoma.  Pulmonary granulomas can have a similar imaging appearance.   Original Report Authenticated By: Britta Mccreedy, M.D.     Scheduled Meds: . antiseptic oral rinse  15 mL Mouth Rinse BID  . dexamethasone  6 mg Intravenous Q6H   Followed by  . [START ON 06/09/2012] dexamethasone  4 mg Intravenous Q6H   Followed by  . [START ON 06/10/2012] dexamethasone  4 mg Intravenous Q8H  . levETIRAcetam  500 mg Intravenous Q12H  . pantoprazole (PROTONIX) IV  40 mg Intravenous QHS   Continuous Infusions: . 0.9 % NaCl with KCl 20 mEq / L 50 mL/hr at 06/08/12 0600     Pamella Pert  [facility] Pager [phone]. If 7 PM - 7 AM, please contact night-coverage at [email or web address], password [facility] 06/08/2012, 8:07 AM  LOS: 3 days

## 2012-06-08 NOTE — Progress Notes (Signed)
Subjective: Patient reports Her function good speech intact. Patient feels well  Objective: Vital signs in last 24 hours: Temp:  [96.6 F (35.9 C)-98.8 F (37.1 C)] 97.4 F (36.3 C) (03/26 0431) Pulse Rate:  [55-76] 59 (03/26 0800) Resp:  [10-20] 18 (03/26 0800) BP: (94-158)/(46-97) 121/64 mmHg (03/26 0800) SpO2:  [93 %-100 %] 94 % (03/26 0800) Arterial Line BP: (113-186)/(60-109) 145/67 mmHg (03/26 0800)  Intake/Output from previous day: 03/25 0701 - 03/26 0700 In: 1919.2 [P.O.:360; I.V.:1454.2; IV Piggyback:105] Out: 740 [Urine:690; Blood:50] Intake/Output this shift: Total I/O In: 290 [P.O.:240; I.V.:50] Out: 200 [Urine:200]  Dressing with some blood on the dressing but still intact. Motor function  Lab Results:  Recent Labs  06/06/12 0710 06/08/12 0517  WBC 3.9* 12.2*  HGB 14.8 12.6  HCT 44.4 39.0  PLT 257 214   BMET  Recent Labs  06/06/12 0710 06/08/12 0517  NA 139 141  K 3.7 4.2  CL 103 108  CO2 21 23  GLUCOSE 171* 142*  BUN 13 16  CREATININE 0.50 0.58  CALCIUM 9.5 8.4    Studies/Results: Ct Head W Contrast  06/06/2012  *RADIOLOGY REPORT*  Clinical Data: 72 Year-old female with right parietal brain mass. Preoperative study for stereotactic surgical planning requested.  CT HEAD WITH CONTRAST  Technique:  Contiguous axial images were obtained from the base of the skull through the vertex with intravenous contrast  Contrast:  100 ml Omnipaque-300 In conjunction with CT(s) of the chest abdomen and pelvis which is(are) reported separately.  Comparison: Brain MRI without and with contrast 06/05/2012.  Findings: Roughly 30 x 24 mm lobulated enhancing mass re-identified in the right parietal lobe with extensive surrounding vasogenic edema involving the posterior right hemisphere and much of the right temporal lobe.  Mass effect on the right lateral ventricle. No ventriculomegaly and stable ventricle size.  Leftward midline shift of 7 mm is stable.  Basilar  cisterns remain patent.  No suspicious osseous lesion identified.  Stable orbit and scalp soft tissues. Calcified atherosclerosis at the skull base.  IMPRESSION: 1.  Study performed for stereotactic surgical planning.  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 2.  Stable cerebral edema and associated mass effect.   Original Report Authenticated By: Erskine Speed, M.D.    Ct Chest W Contrast  06/06/2012  *RADIOLOGY REPORT*  Clinical Data:  Brain tumor, evaluate for primary malignancy.  CT CHEST, ABDOMEN AND PELVIS WITH CONTRAST  Technique:  Multidetector CT imaging of the chest, abdomen and pelvis was performed following the standard protocol during bolus administration of intravenous contrast.  Contrast: OMNIPAQUE IOHEXOL 300 MG/ML  SOLN  Comparison:  CT head 06/05/2012 and MR brain 06/05/2012  CT CHEST  Findings:  There is ectasia of the thoracic aorta without aneurysm. Negative for aortic dissection.  Mild cardiomegaly.  Calcified mediastinal and left hilar lymph nodes are consistent with prior granulomatous disease.  No pathologically enlarged supraclavicular, mediastinal, hilar, or axillary lymph nodes are identified.  There is no pleural or pericardial effusion.  Lung windows are slightly degraded by respiratory motion.  There is a peripheral, noncalcified 6-mm nodule in the right middle lobe on image #28 of lung windows.  Just inferior to this is a 5 mm right middle lobe pulmonary nodule on image number 31.  4 mm pulmonary nodule in the right middle lobe on image number 35.  Likely tiny pulmonary nodules in the medial right middle lobe images on 33 and 34.  Approximately 4 mm peripheral nodule in the left upper lobe on image number 10.  Approximately 3 mm pulmonary nodule right lower lobe on image number 32 and 4 mm pulmonary nodule right lower lobe on image number 37.  There is no airspace disease.  The trachea and mainstem bronchi are  patent.  There is multilevel degenerative change of the thoracic spine.  No osseous metastatic lesion is identified.  IMPRESSION:  1.  Multiple small bilateral pulmonary nodules.  Findings are suspicious for metastatic pulmonary nodules given the suspected brain metastasis and renal mass (described below).  There are no prior chest CTs for comparison. Pulmonary granulomas can have a similar appearance. 2.  Ectasia of the thoracic aorta.  CT ABDOMEN AND PELVIS  Findings:  There is diffuse fatty infiltration of the liver.  No focal hepatic lesion is identified.  There is no biliary ductal dilatation.  The spleen is normal in size and enhancement.  There is a large heterogeneous solid mass with internal vascularity involving the mid and lower poles and extending exophytically from the lower pole of the right kidney.  This mass measures approximately 9.1 x 7.8 x 9.1 cm and has imaging features compatible with a renal cell carcinoma.  The upper pole collecting system of the right kidney is dilated, consistent with obstruction by the mass.  The mid to lower pole right renal collecting system is obliterated by the mass. There is prominent vasculature in the perinephric fat surrounding the mass.  No suspicious mass is identified in the left kidney. There is a small, 4 mm low density lesion in the upper pole of the left kidney that is too small to characterize, but likely a tiny cyst.  There is no hydronephrosis on the left. The left renal collecting system opacifies normally on delayed images.  The proximal right ureter opacifies with contrast on the delayed images.  There is no ureteral dilatation bilaterally.  Urinary bladder appears normal.  A heterogeneous left adrenal gland nodule measures 2.2 x 1.8 cm, extending inferiorly from the lateral limb.  There are no prior imaging studies for comparison.  Metastatic involvement of the left adrenal gland cannot be excluded.  The right adrenal gland, pancreas, common bile duct,  and gallbladder appear within normal limits.  The stomach is nondistended and unremarkable.  Small bowel loops and colon are normal in caliber.  The abdominal aorta contains scattered atherosclerotic disease and is mildly ectatic.  No evidence for aneurysm.  The right renal vein appears patent.  No discrete lymphadenopathy is identified in the abdomen or pelvis.  There is facet joint degenerative change of the lower lumbar spine. No fracture or suspicious osseous lesion is identified.  IMPRESSION:  1.  Large solid mass involving the mid to lower pole of the right kidney has imaging features compatible with a primary renal cell carcinoma (9.1 cm greatest diameter). This mass results in hydronephrosis of the upper pole of the right kidney 2.  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 3.  Multiple small bilateral pulmonary nodules are suspicious for pulmonary metastases given the presence of the right renal cell carcinoma.  Pulmonary granulomas can have a similar imaging appearance.   Original Report Authenticated By: Britta Mccreedy, M.D.    Ct Abdomen Pelvis W Contrast  06/06/2012  *RADIOLOGY REPORT*  Clinical Data:  Brain tumor, evaluate for primary malignancy.  CT CHEST, ABDOMEN AND PELVIS WITH CONTRAST  Technique:  Multidetector CT imaging of the chest, abdomen and pelvis was performed following the  standard protocol during bolus administration of intravenous contrast.  Contrast: OMNIPAQUE IOHEXOL 300 MG/ML  SOLN  Comparison:  CT head 06/05/2012 and MR brain 06/05/2012  CT CHEST  Findings:  There is ectasia of the thoracic aorta without aneurysm. Negative for aortic dissection.  Mild cardiomegaly.  Calcified mediastinal and left hilar lymph nodes are consistent with prior granulomatous disease.  No pathologically enlarged supraclavicular, mediastinal, hilar, or axillary lymph nodes are identified.  There is no pleural or pericardial effusion.  Lung windows are slightly degraded by respiratory  motion.  There is a peripheral, noncalcified 6-mm nodule in the right middle lobe on image #28 of lung windows.  Just inferior to this is a 5 mm right middle lobe pulmonary nodule on image number 31.  4 mm pulmonary nodule in the right middle lobe on image number 35.  Likely tiny pulmonary nodules in the medial right middle lobe images on 33 and 34.  Approximately 4 mm peripheral nodule in the left upper lobe on image number 10.  Approximately 3 mm pulmonary nodule right lower lobe on image number 32 and 4 mm pulmonary nodule right lower lobe on image number 37.  There is no airspace disease.  The trachea and mainstem bronchi are patent.  There is multilevel degenerative change of the thoracic spine.  No osseous metastatic lesion is identified.  IMPRESSION:  1.  Multiple small bilateral pulmonary nodules.  Findings are suspicious for metastatic pulmonary nodules given the suspected brain metastasis and renal mass (described below).  There are no prior chest CTs for comparison. Pulmonary granulomas can have a similar appearance. 2.  Ectasia of the thoracic aorta.  CT ABDOMEN AND PELVIS  Findings:  There is diffuse fatty infiltration of the liver.  No focal hepatic lesion is identified.  There is no biliary ductal dilatation.  The spleen is normal in size and enhancement.  There is a large heterogeneous solid mass with internal vascularity involving the mid and lower poles and extending exophytically from the lower pole of the right kidney.  This mass measures approximately 9.1 x 7.8 x 9.1 cm and has imaging features compatible with a renal cell carcinoma.  The upper pole collecting system of the right kidney is dilated, consistent with obstruction by the mass.  The mid to lower pole right renal collecting system is obliterated by the mass. There is prominent vasculature in the perinephric fat surrounding the mass.  No suspicious mass is identified in the left kidney. There is a small, 4 mm low density lesion in the  upper pole of the left kidney that is too small to characterize, but likely a tiny cyst.  There is no hydronephrosis on the left. The left renal collecting system opacifies normally on delayed images.  The proximal right ureter opacifies with contrast on the delayed images.  There is no ureteral dilatation bilaterally.  Urinary bladder appears normal.  A heterogeneous left adrenal gland nodule measures 2.2 x 1.8 cm, extending inferiorly from the lateral limb.  There are no prior imaging studies for comparison.  Metastatic involvement of the left adrenal gland cannot be excluded.  The right adrenal gland, pancreas, common bile duct, and gallbladder appear within normal limits.  The stomach is nondistended and unremarkable.  Small bowel loops and colon are normal in caliber.  The abdominal aorta contains scattered atherosclerotic disease and is mildly ectatic.  No evidence for aneurysm.  The right renal vein appears patent.  No discrete lymphadenopathy is identified in the abdomen or  pelvis.  There is facet joint degenerative change of the lower lumbar spine. No fracture or suspicious osseous lesion is identified.  IMPRESSION:  1.  Large solid mass involving the mid to lower pole of the right kidney has imaging features compatible with a primary renal cell carcinoma (9.1 cm greatest diameter). This mass results in hydronephrosis of the upper pole of the right kidney 2.  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 3.  Multiple small bilateral pulmonary nodules are suspicious for pulmonary metastases given the presence of the right renal cell carcinoma.  Pulmonary granulomas can have a similar imaging appearance.   Original Report Authenticated By: Britta Mccreedy, M.D.     Assessment/Plan: Stable postop  LOS: 3 days  Discontinue Foley and A-line mobilize patient consider transfer later today   Jevon Shells J 06/08/2012, 9:38 AM

## 2012-06-08 NOTE — Addendum Note (Signed)
Addendum created 06/08/12 0631 by Ellin Goodie, CRNA   Modules edited: Anesthesia Events

## 2012-06-09 ENCOUNTER — Encounter (HOSPITAL_COMMUNITY): Payer: Self-pay | Admitting: Neurological Surgery

## 2012-06-09 ENCOUNTER — Ambulatory Visit
Admit: 2012-06-09 | Discharge: 2012-06-09 | Disposition: A | Discharge status: Home / Self Care | Payer: Medicare Other | Attending: Radiation Oncology | Admitting: Radiation Oncology

## 2012-06-09 DIAGNOSIS — C7800 Secondary malignant neoplasm of unspecified lung: Secondary | ICD-10-CM

## 2012-06-09 DIAGNOSIS — C7970 Secondary malignant neoplasm of unspecified adrenal gland: Secondary | ICD-10-CM

## 2012-06-09 LAB — CBC
MCH: 28.3 pg (ref 26.0–34.0)
MCV: 89 fL (ref 78.0–100.0)
Platelets: 209 10*3/uL (ref 150–400)
RDW: 13.6 % (ref 11.5–15.5)
WBC: 9.5 10*3/uL (ref 4.0–10.5)

## 2012-06-09 MED ORDER — LISINOPRIL 10 MG PO TABS
10.0000 mg | ORAL_TABLET | Freq: Every day | ORAL | Status: DC
  Administered 2012-06-09 – 2012-06-11 (×3): 10 mg via ORAL
  Filled 2012-06-09 (×3): qty 1

## 2012-06-09 MED ORDER — WHITE PETROLATUM GEL
Status: AC
  Filled 2012-06-09: qty 5

## 2012-06-09 NOTE — Progress Notes (Signed)
[facility] PROGRESS NOTE  Assessment/Plan: Brain tumor - MRI 3.23.2014: as below. Midline shift, right patietal mass and edema. Now s/p right parietal craniotomy with mass resection. Awaiting pathology. - CT chest abdomen and pelvis as below. Urology is following for renal mass. - neurosurgery, rec steroids. - She is doing well postoperatively, appreciate neurosurgery recommendations. - medical oncology consulted today pending pathology, given renal mass and extensive metastatic disease.   Blood pressure elevated - good renal function, start ACEI today   Code Status: full Family Communication: none this morning, planned family meeting today at 1 pm Disposition Plan: inpatient.  Consultants:  Neurosurgery  Urology  Oncology  Procedures: - MRI 3.23.2014: Solitary mass lesion right parietal lobe with a large amount of  surrounding edema and mass effect. - ct abdomen: 3.23.2014: Multiple small bilateral pulmonary nodules, Large solid mass involving the mid to lower pole of the right kidney has imaging features compatible with a primary renal cell carcinoma (9.1 cm greatest diameter)  Antibiotics:  None  HPI/Subjective: - feels well this morning  Objective: Filed Vitals:   06/09/12 0100 06/09/12 0245 06/09/12 0330 06/09/12 0645  BP: 135/55 165/76 160/68 163/69  Pulse: 57 63 54 69  Temp:  97.4 F (36.3 C)  97.6 F (36.4 C)  TempSrc:      Resp: 19 20  18   Height:      Weight:      SpO2: 97% 98%  97%    Intake/Output Summary (Last 24 hours) at 06/09/12 0850 Last data filed at 06/09/12 0100  Gross per 24 hour  Intake   1540 ml  Output    525 ml  Net   1015 ml   Filed Weights   06/05/12 2115  Weight: 59.92 kg (132 lb 1.6 oz)    Exam:  General: Alert, awake, oriented x3, in no acute distress.  HEENT: No bruits, no goiter.  Heart: Regular rate and rhythm, without murmurs, rubs, gallops.  Lungs: Good air movement, clear to auscultation. Abdomen: Soft,  nontender, nondistended, positive bowel sounds.   Data Reviewed: Basic Metabolic Panel:  Recent Labs Lab 06/05/12 1604 06/05/12 1800 06/06/12 0710 06/08/12 0517  NA 139 143 139 141  K 3.8 3.9 3.7 4.2  CL 102 108 103 108  CO2 25  --  21 23  GLUCOSE 102* 101* 171* 142*  BUN 14 16 13 16   CREATININE 0.68 0.70 0.50 0.58  CALCIUM 9.4  --  9.5 8.4   Liver Function Tests:  Recent Labs Lab 06/05/12 1604  AST 15  ALT 10  ALKPHOS 82  BILITOT 0.3  PROT 7.7  ALBUMIN 3.6   CBC:  Recent Labs Lab 06/05/12 1604 06/05/12 1736 06/05/12 1800 06/06/12 0710 06/08/12 0517 06/09/12 0520  WBC 7.3 7.1  --  3.9* 12.2* 9.5  NEUTROABS 4.6 4.0  --   --   --   --   HGB 14.6 14.6 15.0 14.8 12.6 12.6  HCT 43.3 43.5 44.0 44.4 39.0 39.6  MCV 87.7 88.1  --  87.2 88.8 89.0  PLT 249 236  --  257 214 209   Cardiac Enzymes:  Recent Labs Lab 06/05/12 1604  TROPONINI <0.30   CBG:  Recent Labs Lab 06/06/12 0647 06/07/12 0624 06/07/12 1612 06/07/12 1842  GLUCAP 187* 151* 151* 155*    Recent Results (from the past 240 hour(s))  SURGICAL PCR SCREEN     Status: None   Collection Time    06/06/12 10:58 PM  Result Value Range Status   MRSA, PCR NEGATIVE  NEGATIVE Final   Staphylococcus aureus NEGATIVE  NEGATIVE Final   Comment:            The Xpert SA Assay (FDA     approved for NASAL specimens     in patients over 72 years of age),     is one component of     a comprehensive surveillance     program.  Test performance has     been validated by [employer] for patients greater     than or equal to 33 year old.     It is not intended     to diagnose infection nor to     guide or monitor treatment.  MRSA PCR SCREENING     Status: None   Collection Time    06/07/12  9:16 PM      Result Value Range Status   MRSA by PCR NEGATIVE  NEGATIVE Final   Comment:            The GeneXpert MRSA Assay (FDA     approved for NASAL specimens     only), is one component of a      comprehensive MRSA colonization     surveillance program. It is not     intended to diagnose MRSA     infection nor to guide or     monitor treatment for     MRSA infections.     Studies: No results found.  Scheduled Meds: . antiseptic oral rinse  15 mL Mouth Rinse BID  . dexamethasone  4 mg Intravenous Q6H   Followed by  . [START ON 06/10/2012] dexamethasone  4 mg Intravenous Q8H  . levETIRAcetam  500 mg Intravenous Q12H  . pantoprazole (PROTONIX) IV  40 mg Intravenous QHS   Continuous Infusions: . 0.9 % NaCl with KCl 20 mEq / L 50 mL/hr at 06/09/12 0100     Pamella Pert  [facility] Pager [phone]. If 7 PM - 7 AM, please contact night-coverage at [email or web address], password [facility] 06/09/2012, 8:50 AM  LOS: 4 days

## 2012-06-09 NOTE — Progress Notes (Signed)
Patient ID: Sue Ray, female   DOB: [birth year], 72 y.o.   MRN: [id number] Postop day 2 status post resection of parietal metastatic lesion. Pathology pending. Motor function appears intact. Dressing is clean and dry. Patient has been transferred to the floor. Awaiting final path but obtaining oncology consult would be appropriate at this time. Radiation oncology also to get involved.

## 2012-06-09 NOTE — Progress Notes (Signed)
Report called to nurse on 4N.  Preparing pt for transport.

## 2012-06-10 ENCOUNTER — Other Ambulatory Visit: Payer: Self-pay | Admitting: Radiation Therapy

## 2012-06-10 DIAGNOSIS — C7931 Secondary malignant neoplasm of brain: Principal | ICD-10-CM

## 2012-06-10 DIAGNOSIS — C649 Malignant neoplasm of unspecified kidney, except renal pelvis: Secondary | ICD-10-CM

## 2012-06-10 LAB — TYPE AND SCREEN: Unit division: 0

## 2012-06-10 MED ORDER — PANTOPRAZOLE SODIUM 40 MG PO TBEC
40.0000 mg | DELAYED_RELEASE_TABLET | Freq: Every day | ORAL | Status: DC
  Administered 2012-06-10: 40 mg via ORAL
  Filled 2012-06-10: qty 1

## 2012-06-10 NOTE — Consult Note (Signed)
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 disease.  No pathologically enlarged supraclavicular, mediastinal, hilar, or axillary lymph nodes are identified.  There is no pleural or pericardial effusion.  Lung windows are slightly degraded by respiratory motion.  There is a peripheral, noncalcified 6-mm nodule in the right middle lobe on image #28 of lung windows.  Just inferior to this is a 5 mm right middle lobe pulmonary nodule  on image number 31.  4 mm pulmonary nodule in the right middle lobe on image number 35.  Likely tiny pulmonary nodules in the medial right middle lobe images on 33 and 34.  Approximately 4 mm peripheral nodule in the left upper lobe on image number 10.  Approximately 3 mm pulmonary nodule right lower lobe on image number 32 and 4 mm pulmonary nodule right lower lobe on image number 37.  There is no airspace disease.  The trachea and mainstem bronchi are patent.  There is multilevel degenerative change of the thoracic spine.  No osseous metastatic lesion is identified.  IMPRESSION:  1.  Multiple small bilateral pulmonary nodules.  Findings are suspicious for metastatic pulmonary nodules given the suspected brain metastasis and renal mass (described below).  There are  no prior chest CTs for comparison. Pulmonary granulomas can have a similar appearance. 2.  Ectasia of the thoracic aorta.  CT ABDOMEN AND PELVIS  Findings:  There is diffuse fatty infiltration of the liver.  No focal hepatic lesion is identified.  There is no biliary ductal dilatation.  The spleen is normal in size and enhancement.  There is a large heterogeneous solid mass with internal vascularity involving the mid and lower poles and extending exophytically from the lower pole of the right kidney.  This mass measures approximately 9.1 x 7.8 x 9.1 cm and has imaging features compatible with a renal cell carcinoma.  The upper pole collecting system of the right kidney is dilated, consistent with obstruction by the mass.  The mid to lower pole right renal collecting system is obliterated by the mass. There is prominent vasculature in the perinephric fat surrounding the mass.  No suspicious mass is identified in the left kidney. There is a small, 4 mm low density lesion in the upper pole of the left kidney that is too small to characterize, but likely a tiny cyst.  There is no hydronephrosis on the left. The left renal collecting system opacifies normally on delayed images.  The proximal right ureter opacifies with contrast on the delayed images.  There is no ureteral dilatation bilaterally.  Urinary bladder appears normal.  A heterogeneous left adrenal gland nodule measures 2.2 x 1.8 cm, extending inferiorly from the lateral limb.  There are no prior imaging studies for comparison.  Metastatic involvement of the left adrenal gland cannot be excluded.  The right adrenal gland, pancreas, common bile duct, and gallbladder appear within normal limits.  The stomach is nondistended and unremarkable.  Small bowel loops and colon are normal in caliber.  The abdominal aorta contains scattered atherosclerotic disease and is mildly ectatic.  No evidence for aneurysm.  The right renal vein appears patent.  No discrete  lymphadenopathy is identified in the abdomen or pelvis.  There is facet joint degenerative change of the lower lumbar spine. No fracture or suspicious osseous lesion is identified.  IMPRESSION:  1.  Large solid mass involving the mid to lower pole of the right kidney has imaging features compatible with a primary renal cell carcinoma (9.1 cm greatest diameter). This mass results in hydronephrosis of the upper pole of the right kidney 2.  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 3.  Multiple small bilateral pulmonary nodules are suspicious for pulmonary metastases given the presence of the right renal cell carcinoma.  Pulmonary granulomas can have a similar imaging appearance.   Original Report Authenticated By: Britta Mccreedy, M.D.    Mr Laqueta Jean ZO Contrast  06/05/2012  *RADIOLOGY  REPORT*  Clinical Data: Slurred speech and difficulty walking.  Ataxia. Abnormal CT  MRI HEAD WITHOUT AND WITH CONTRAST  Technique:  Multiplanar, multiecho pulse sequences of the brain and surrounding structures were obtained according to standard protocol without and with intravenous contrast  Contrast: 13mL MULTIHANCE GADOBENATE DIMEGLUMINE 529 MG/ML IV SOLN  Comparison: None.  Findings: Enhancing mass lesion in the right parietal lobe with a large amount of surrounding white matter edema.  The mass shows intense and homogeneous enhancement and measures 23 x 29 mm mm on axial images.   There are prominent enhancing vessels around the mass compatible with large veins.  There is a large amount of mass effect and edema in the right temporal parietal lobe.  This causes 7 mm midline shift to the left with compression of the right lateral ventricle.  No other enhancing mass lesions are identified.  Negative for acute infarct.  Brainstem and cerebellum are intact.  IMPRESSION: Solitary mass lesion right parietal lobe with a large amount of surrounding edema and mass effect.  The mass shows homogeneous intense enhancement and has  prominent draining veins.  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  Primary brain tumor considered less likely.   Original Report Authenticated By: Janeece Riggers, M.D.    Ct Abdomen Pelvis W Contrast  06/06/2012  *RADIOLOGY REPORT*  Clinical Data:  Brain tumor, evaluate for primary malignancy.  CT CHEST, ABDOMEN AND PELVIS WITH CONTRAST  Technique:  Multidetector CT imaging of the chest, abdomen and pelvis was performed following the standard protocol during bolus administration of intravenous contrast.  Contrast: OMNIPAQUE IOHEXOL 300 MG/ML  SOLN  Comparison:  CT head 06/05/2012 and MR brain 06/05/2012  CT CHEST  Findings:  There is ectasia of the thoracic aorta without aneurysm. Negative for aortic dissection.  Mild cardiomegaly.  Calcified mediastinal and left hilar lymph nodes are consistent with prior granulomatous disease.  No pathologically enlarged supraclavicular, mediastinal, hilar, or axillary lymph nodes are identified.  There is no pleural or pericardial effusion.  Lung windows are slightly degraded by respiratory motion.  There is a peripheral, noncalcified 6-mm nodule in the right middle lobe on image #28 of lung windows.  Just inferior to this is a 5 mm right middle lobe pulmonary nodule on image number 31.  4 mm pulmonary nodule in the right middle lobe on image number 35.  Likely tiny pulmonary nodules in the medial right middle lobe images on 33 and 34.  Approximately 4 mm peripheral nodule in the left upper lobe on image number 10.  Approximately 3 mm pulmonary nodule right lower lobe on image number 32 and 4 mm pulmonary nodule right lower lobe on image number 37.  There is no airspace disease.  The trachea and mainstem bronchi are patent.  There is multilevel degenerative change of the thoracic spine.  No osseous metastatic lesion is identified.  IMPRESSION:  1.  Multiple small bilateral pulmonary nodules.  Findings are suspicious for metastatic pulmonary nodules  given the suspected brain metastasis and renal mass (described below).  There are no prior chest CTs for comparison. Pulmonary granulomas can have a similar appearance. 2.  Ectasia of the thoracic aorta.  CT ABDOMEN AND PELVIS  Findings:  There is diffuse fatty infiltration of the liver.  No focal hepatic lesion is identified.  There is no biliary ductal dilatation.  The spleen is normal in size and enhancement.  There is a large heterogeneous solid mass with internal vascularity  involving the mid and lower poles and extending exophytically from the lower pole of the right kidney.  This mass measures approximately 9.1 x 7.8 x 9.1 cm and has imaging features compatible with a renal cell carcinoma.  The upper pole collecting system of the right kidney is dilated, consistent with obstruction by the mass.  The mid to lower pole right renal collecting system is obliterated by the mass. There is prominent vasculature in the perinephric fat surrounding the mass.  No suspicious mass is identified in the left kidney. There is a small, 4 mm low density lesion in the upper pole of the left kidney that is too small to characterize, but likely a tiny cyst.  There is no hydronephrosis on the left. The left renal collecting system opacifies normally on delayed images.  The proximal right ureter opacifies with contrast on the delayed images.  There is no ureteral dilatation bilaterally.  Urinary bladder appears normal.  A heterogeneous left adrenal gland nodule measures 2.2 x 1.8 cm, extending inferiorly from the lateral limb.  There are no prior imaging studies for comparison.  Metastatic involvement of the left adrenal gland cannot be excluded.  The right adrenal gland, pancreas, common bile duct, and gallbladder appear within normal limits.  The stomach is nondistended and unremarkable.  Small bowel loops and colon are normal in caliber.  The abdominal aorta contains scattered atherosclerotic disease and is mildly ectatic.  No  evidence for aneurysm.  The right renal vein appears patent.  No discrete lymphadenopathy is identified in the abdomen or pelvis.  There is facet joint degenerative change of the lower lumbar spine. No fracture or suspicious osseous lesion is identified.  IMPRESSION:  1.  Large solid mass involving the mid to lower pole of the right kidney has imaging features compatible with a primary renal cell carcinoma (9.1 cm greatest diameter). This mass results in hydronephrosis of the upper pole of the right kidney 2.  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 3.  Multiple small bilateral pulmonary nodules are suspicious for pulmonary metastases given the presence of the right renal cell carcinoma.  Pulmonary granulomas can have a similar imaging appearance.   Original Report Authenticated By: Britta Mccreedy, M.D.     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 Patient is now status post resection of the solitary brain met. Postoperatively she is doing well. She was seen by urology and I have not recommended a nephrectomy. Patient's postoperatively doing well. She is a good candidate for radiation to the brain. I have recommended that she be seen by radiation oncology. Certainly once patient recovers she would be a candidate for systemic therapy. I discussed targeted therapies with the patient and her family. We have many options available at this time. These would include Sutent, affinitor, torisel. All of these therapies would begin after patient is discharged and has completed radiation. I will ask one of my radiation oncology colleagues to see the patient while she is hospitalized. Should the patient be discharged to home over the weekend I will be happy to see her back in followup in my clinic. My business cards were given to the patient and her family.  Drue Second, MD Medical/Oncology [facility] [phone] (beeper) [phone] (Office)

## 2012-06-10 NOTE — Progress Notes (Signed)
[facility] PROGRESS NOTE  Assessment/Plan: Metastatic renal cell carcinoma - MRI 3.23.2014: as below. Midline shift, right patietal mass and edema. Now s/p right parietal craniotomy with mass resection. Awaiting pathology. - CT chest abdomen and pelvis as below. Urology is following for renal mass. - neurosurgery, rec steroids. Appreciate recommendations for discharge and safety for discharge from neurosurgical standpoint.  - She is doing well postoperatively, appreciate neurosurgery recommendations. - medical oncology consulted today pending pathology, given renal mass and extensive metastatic disease.   Blood pressure elevated - good renal function, started ACEI   Code Status: full Family Communication: 4 family members in the room  Disposition Plan: inpatient.  Consultants:  Neurosurgery  Urology  Oncology  Procedures: - MRI 3.23.2014: Solitary mass lesion right parietal lobe with a large amount of  surrounding edema and mass effect. - ct abdomen: 3.23.2014: Multiple small bilateral pulmonary nodules, Large solid mass involving the mid to lower pole of the right kidney has imaging features compatible with a primary renal cell carcinoma (9.1 cm greatest diameter)  Antibiotics:  None  HPI/Subjective: - feels well this morning, able to walk  Objective: Filed Vitals:   06/09/12 1800 06/09/12 2110 06/10/12 0137 06/10/12 0537  BP: 130/49 151/88 156/63 155/63  Pulse: 55 65 76 53  Temp: 97.6 F (36.4 C) 97.4 F (36.3 C) 97.5 F (36.4 C) 97.8 F (36.6 C)  TempSrc: Oral Oral Oral Oral  Resp: 18 16 18 18   Height:      Weight:      SpO2: 94% 95% 94% 96%    Intake/Output Summary (Last 24 hours) at 06/10/12 1040 Last data filed at 06/09/12 2220  Gross per 24 hour  Intake 1276.67 ml  Output      0 ml  Net 1276.67 ml   Filed Weights   06/05/12 2115  Weight: 59.92 kg (132 lb 1.6 oz)    Exam:  General: Alert, awake, oriented x3, in no acute distress.   HEENT: No bruits, no goiter.  Heart: Regular rate and rhythm, without murmurs, rubs, gallops.  Lungs: Good air movement, clear to auscultation. Abdomen: Soft, nontender, nondistended, positive bowel sounds.   Data Reviewed: Basic Metabolic Panel:  Recent Labs Lab 06/05/12 1604 06/05/12 1800 06/06/12 0710 06/08/12 0517  NA 139 143 139 141  K 3.8 3.9 3.7 4.2  CL 102 108 103 108  CO2 25  --  21 23  GLUCOSE 102* 101* 171* 142*  BUN 14 16 13 16   CREATININE 0.68 0.70 0.50 0.58  CALCIUM 9.4  --  9.5 8.4   Liver Function Tests:  Recent Labs Lab 06/05/12 1604  AST 15  ALT 10  ALKPHOS 82  BILITOT 0.3  PROT 7.7  ALBUMIN 3.6   CBC:  Recent Labs Lab 06/05/12 1604 06/05/12 1736 06/05/12 1800 06/06/12 0710 06/08/12 0517 06/09/12 0520  WBC 7.3 7.1  --  3.9* 12.2* 9.5  NEUTROABS 4.6 4.0  --   --   --   --   HGB 14.6 14.6 15.0 14.8 12.6 12.6  HCT 43.3 43.5 44.0 44.4 39.0 39.6  MCV 87.7 88.1  --  87.2 88.8 89.0  PLT 249 236  --  257 214 209   Cardiac Enzymes:  Recent Labs Lab 06/05/12 1604  TROPONINI <0.30   CBG:  Recent Labs Lab 06/06/12 0647 06/07/12 0624 06/07/12 1612 06/07/12 1842  GLUCAP 187* 151* 151* 155*    Recent Results (from the past 240 hour(s))  SURGICAL PCR SCREEN  Status: None   Collection Time    06/06/12 10:58 PM      Result Value Range Status   MRSA, PCR NEGATIVE  NEGATIVE Final   Staphylococcus aureus NEGATIVE  NEGATIVE Final   Comment:            The Xpert SA Assay (FDA     approved for NASAL specimens     in patients over 79 years of age),     is one component of     a comprehensive surveillance     program.  Test performance has     been validated by [employer] for patients greater     than or equal to 22 year old.     It is not intended     to diagnose infection nor to     guide or monitor treatment.  MRSA PCR SCREENING     Status: None   Collection Time    06/07/12  9:16 PM      Result Value Range Status    MRSA by PCR NEGATIVE  NEGATIVE Final   Comment:            The GeneXpert MRSA Assay (FDA     approved for NASAL specimens     only), is one component of a     comprehensive MRSA colonization     surveillance program. It is not     intended to diagnose MRSA     infection nor to guide or     monitor treatment for     MRSA infections.     Studies: No results found.  Scheduled Meds: . antiseptic oral rinse  15 mL Mouth Rinse BID  . dexamethasone  4 mg Intravenous Q8H  . levETIRAcetam  500 mg Intravenous Q12H  . lisinopril  10 mg Oral Daily  . pantoprazole  40 mg Oral QHS   Continuous Infusions: . 0.9 % NaCl with KCl 20 mEq / L 1,000 mL (06/09/12 1913)     Pamella Pert  [facility] Pager [phone]. If 7 PM - 7 AM, please contact night-coverage at [email or web address], password [facility] 06/10/2012, 10:40 AM  LOS: 5 days
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
## 2012-06-10 NOTE — Progress Notes (Signed)
Subjective: Patient reports she is feeling better. Walking is improving. Speech is clear.  Objective: Vital signs in last 24 hours: Temp:  [97.4 F (36.3 C)-98 F (36.7 C)] 98 F (36.7 C) (03/28 1030) Pulse Rate:  [53-77] 77 (03/28 1030) Resp:  [16-18] 18 (03/28 1030) BP: (130-156)/(49-88) 148/70 mmHg (03/28 1030) SpO2:  [94 %-96 %] 94 % (03/28 1030)  Intake/Output from previous day: 03/27 0701 - 03/28 0700 In: 1276.7 [I.V.:1066.7; IV Piggyback:210] Out: -  Intake/Output this shift:    Motor function is intact. Incision is clean and dry.  Lab Results:  Recent Labs  06/08/12 0517 06/09/12 0520  WBC 12.2* 9.5  HGB 12.6 12.6  HCT 39.0 39.6  PLT 214 209   BMET  Recent Labs  06/08/12 0517  NA 141  K 4.2  CL 108  CO2 23  GLUCOSE 142*  BUN 16  CREATININE 0.58  CALCIUM 8.4    Studies/Results: No results found.  Assessment/Plan: Stable postop.  LOS: 5 days  Okay to discharge from  neuurosurggical standpoint. Patient has followups for medical oncology and radiation oncology. I will see the patient in a week to 10 days for staple removal.   Sue Ray J 06/10/2012, 4:30 PM

## 2012-06-10 NOTE — Clinical Social Work Note (Signed)
Clinical Social Work   CSW attempted to meet with pt, however she was on the phone with a friend. Her family member requested for this CSW to follow up at a later date. CSW will ask weekend CSW to follow up. Pt will likely discharge to home in the morning.  Dede Query, MSW, LCSW [phone]

## 2012-06-11 MED ORDER — LISINOPRIL 10 MG PO TABS: 10.0000 mg | ORAL_TABLET | Freq: Every day | ORAL | Status: DC

## 2012-06-11 MED ORDER — HYDROCODONE-ACETAMINOPHEN 5-325 MG PO TABS: 1.0000 | ORAL_TABLET | Freq: Four times a day (QID) | ORAL | Status: DC | PRN

## 2012-06-11 MED ORDER — METHYLPREDNISOLONE 4 MG PO KIT: PACK | ORAL | Status: DC

## 2012-06-11 MED ORDER — LEVETIRACETAM 500 MG PO TABS
500.0000 mg | ORAL_TABLET | Freq: Two times a day (BID) | ORAL | Status: DC
  Filled 2012-06-11: qty 1

## 2012-06-11 MED ORDER — LEVETIRACETAM 500 MG PO TABS
500.0000 mg | ORAL_TABLET | Freq: Two times a day (BID) | ORAL | Status: DC
Start: 2012-06-11 — End: 2012-09-09

## 2012-06-11 NOTE — Progress Notes (Signed)
Patient ready for discharged; discharged home with her family; received rolling walker and bedside commode; discharge instructions and reviewed medications; noted to call Dr.Elsner's office on Monday to get to the office For staple removal from her head incision this week.  Verbalized understanding; discharged home with family.

## 2012-06-11 NOTE — Progress Notes (Signed)
NCM discussed HH PT/OT and offered choice. Choice is limited with [employer]. Pt agreeable to [facility] for HH. Contacted [employer] DME rep for RW and 3n1 for home. Isidoro Donning RN CCM Case Mgmt phone [phone]
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
## 2012-06-11 NOTE — Evaluation (Signed)
Physical Therapy Evaluation Patient Details Name: Sue Ray MRN: [id number] DOB: 08/02/1940 Today's Date: 06/11/2012 Time: 5784-6962 PT Time Calculation (min): 36 min  PT Assessment / Plan / Recommendation Clinical Impression  Pt is a 72 yo female s/p R parietal crani for excision of brain tumor. Pt with mild balance impairement and cognitive deficits including problem solving, executive functioning, and memory. Pt safe to d/c home with assist of family 24/7, HHPT, HHOT, use of RW and 3n1 commode; when medically stable.    PT Assessment  Patient needs continued PT services    Follow Up Recommendations  Home health PT;Supervision/Assistance - 24 hour (home health OT)    Does the patient have the potential to tolerate intense rehabilitation      Barriers to Discharge None      Equipment Recommendations  Rolling walker with 5" wheels (YOUTH, pt 4'11")    Recommendations for Other Services     Frequency Min 3X/week    Precautions / Restrictions Precautions Precautions: Fall Restrictions Weight Bearing Restrictions: No   Pertinent Vitals/Pain Denies pain      Mobility  Bed Mobility Bed Mobility: Not assessed Transfers Transfers: Sit to Stand;Stand to Sit Sit to Stand: 4: Min guard;With upper extremity assist;With armrests;From chair/3-in-1 Stand to Sit: 4: Min guard;With upper extremity assist Details for Transfer Assistance: guarded/cautious Ambulation/Gait Ambulation/Gait Assistance: 4: Min guard;4: Min assist Ambulation Distance (Feet): 120 Feet Assistive device: Rolling walker;None Ambulation/Gait Assistance Details: minA for walker management around obstacles, pt with increased stabiltiy with RW compared to without however requires assist when amb with or without RW Gait Pattern: Step-through pattern;Decreased stride length Gait velocity: decreased/guarded/cautious Stairs: Yes Stairs Assistance: 4: Min assist Stairs Assistance Details (indicate cue type  and reason): assist to steady pt Stair Management Technique: One rail Right Number of Stairs: 4 Wheelchair Mobility Wheelchair Mobility: No    Exercises     PT Diagnosis:    PT Problem List: Decreased activity tolerance;Decreased balance;Decreased cognition PT Treatment Interventions: DME instruction;Gait training;Stair training;Functional mobility training;Balance training   PT Goals Acute Rehab PT Goals PT Goal Formulation: With patient/family Time For Goal Achievement: 06/18/12 Potential to Achieve Goals: Good Pt will Ambulate: >150 feet;with modified independence;with rolling walker PT Goal: Ambulate - Progress: Goal set today Pt will Go Up / Down Stairs: 3-5 stairs;with supervision;with rail(s) PT Goal: Up/Down Stairs - Progress: Goal set today Additional Goals Additional Goal #1: pt to score >19 on DGI to indicate decreased falls risk. PT Goal: Additional Goal #1 - Progress: Goal set today  Visit Information  Last PT Received On: 06/11/12 Assistance Needed: +1    Subjective Data  Subjective: Pt received sitting up in chair with family presenting Patient Stated Goal: home   Prior Functioning  Home Living Lives With: Spouse;Daughter Available Help at Discharge: Family;Available 24 hours/day Type of Home: House Home Access: Stairs to enter [employer] of Steps: 4 Entrance Stairs-Rails: Right Home Layout: One level Bathroom Shower/Tub: Engineer, manufacturing systems: Standard Bathroom Accessibility: Yes How Accessible: Accessible via walker Home Adaptive Equipment: None Prior Function Level of Independence: Needs assistance Needs Assistance: Dressing;Bathing;Meal Prep;Light Housekeeping Bath: Supervision/set-up Dressing: Supervision/set-up Meal Prep: Maximal Light Housekeeping: Moderate Able to Take Stairs?: Yes Driving: No Vocation: Retired Musician: No difficulties Dominant Hand: Right    Cognition  Cognition Overall  Cognitive Status: Impaired Arousal/Alertness: Awake/alert Orientation Level: Oriented X4 / Intact Behavior During Session: WFL for tasks performed Cognition - Other Comments: pt with delayed processing, increased time for problem solving,  v/c's for walker management    Extremity/Trunk Assessment Right Upper Extremity Assessment RUE ROM/Strength/Tone: Within functional levels RUE Sensation: WFL - Light Touch RUE Coordination: WFL - gross/fine motor Left Upper Extremity Assessment LUE ROM/Strength/Tone: Within functional levels LUE Sensation: WFL - Light Touch LUE Coordination: WFL - gross/fine motor Right Lower Extremity Assessment RLE ROM/Strength/Tone: Within functional levels RLE Sensation: WFL - Light Touch RLE Coordination: WFL - gross/fine motor Left Lower Extremity Assessment LLE ROM/Strength/Tone: Within functional levels LLE Sensation: WFL - Light Touch LLE Coordination: WFL - gross/fine motor Trunk Assessment Trunk Assessment: Normal   Balance    End of Session PT - End of Session Equipment Utilized During Treatment: Gait belt Activity Tolerance: Patient tolerated treatment well Patient left: in chair;with call bell/phone within reach;with family/visitor present Nurse Communication: Mobility status  GP     Marcene Brawn 06/11/2012, 11:35 AM  Lewis Shock, PT, DPT Pager #: [phone] Office #: [phone]

## 2012-06-12 DIAGNOSIS — C641 Malignant neoplasm of right kidney, except renal pelvis: Secondary | ICD-10-CM | POA: Insufficient documentation

## 2012-07-04 ENCOUNTER — Ambulatory Visit (INDEPENDENT_AMBULATORY_CARE_PROVIDER_SITE_OTHER): Payer: Medicare Other | Admitting: Family

## 2012-07-04 ENCOUNTER — Encounter: Payer: Self-pay | Admitting: *Deleted

## 2012-07-04 ENCOUNTER — Encounter: Payer: Self-pay | Admitting: Family

## 2012-07-04 VITALS — BP 120/74 | HR 80 | Wt 131.0 lb

## 2012-07-04 DIAGNOSIS — C801 Malignant (primary) neoplasm, unspecified: Secondary | ICD-10-CM

## 2012-07-04 DIAGNOSIS — G9389 Other specified disorders of brain: Secondary | ICD-10-CM

## 2012-07-04 DIAGNOSIS — G939 Disorder of brain, unspecified: Secondary | ICD-10-CM

## 2012-07-04 DIAGNOSIS — C649 Malignant neoplasm of unspecified kidney, except renal pelvis: Secondary | ICD-10-CM

## 2012-07-04 DIAGNOSIS — C7800 Secondary malignant neoplasm of unspecified lung: Secondary | ICD-10-CM

## 2012-07-04 DIAGNOSIS — I10 Essential (primary) hypertension: Secondary | ICD-10-CM

## 2012-07-04 DIAGNOSIS — C641 Malignant neoplasm of right kidney, except renal pelvis: Secondary | ICD-10-CM

## 2012-07-04 NOTE — Progress Notes (Signed)
Subjective:    Patient ID: Sue Ray, female    DOB: 01/18/1941, 72 y.o.   MRN: [id number]  HPI  72 year old white female, non smoker that presents to the office today as a new patient to the practice. She verbalizes she has not had routine medical following in at least 10 years prior to going to hospital in mid March for dizziness, slurred speech, lower extremity edema and balance disturbances. She states this is when it was discovered that she had brain mass and edema and had craniotomy surgery on June 07, 2012, patient of Dr. Barnett Abu. She verbalizes she is doing well; pre-surgical deficits resolved following surgery; however acknowledges tires easily, otherwise has not had any difficulties following surgery. She is in good spirits, denies depression, insomnia or sleep difficulties, has good family support and is scheduled for radiation treatment in coming weeks.   Patient reports scalp numbness at surgical incision with occasional tingling sensations. Reports no seizures, on Keppra; stable on current medications. She verbalizes has not taken or needed  Hydrocodone/Acetaminophen 5/325mg   q6hrs prn for pain following surgery.      Review of Systems  Constitutional: Positive for fatigue.  HENT: Negative.        Numbness and tingling at surgical incision site of scalp  Eyes: Positive for visual disturbance.       History of macular degeneration right eye, wears reader glasses  Respiratory: Negative.   Cardiovascular: Negative.   Gastrointestinal: Negative.   Endocrine: Negative.   Genitourinary: Negative.   Musculoskeletal: Negative.   Skin: Negative.   Allergic/Immunologic:       Allergic to sulfa medications  Neurological: Negative.        Seizure prophylaxis with Keppra  Hematological: Negative.   Psychiatric/Behavioral: Negative.    Past Medical History  Diagnosis Date  . Macular degeneration     History   Social History  . Marital Status: Married    Spouse  Name: N/A    Number of Children: N/A  . Years of Education: N/A   Occupational History  . Not on file.   Social History Main Topics  . Smoking status: Current Every Day Smoker    Types: Cigarettes  . Smokeless tobacco: Not on file  . Alcohol Use: Yes  . Drug Use: Not on file  . Sexually Active: Not on file     Comment: electronic cigarette   Other Topics Concern  . Not on file   Social History Narrative  . No narrative on file    Past Surgical History  Procedure Laterality Date  . Craniotomy Right 06/07/2012    Procedure: CRANIOTOMY TUMOR EXCISION;  Surgeon: Barnett Abu, MD;  Location: [facility];  Service: Neurosurgery;  Laterality: Right;  Right Parietal Craniotomy for Tumor with Stealth    Family History  Problem Relation Age of Onset  . Brain cancer Neg Hx     Allergies  Allergen Reactions  . Sulfa Antibiotics     Unknown    Current Outpatient Prescriptions on File Prior to Visit  Medication Sig Dispense Refill  . Bevacizumab (AVASTIN IV) Inject into the vein every 30 (thirty) days.      Marland Kitchen HYDROcodone-acetaminophen (NORCO/VICODIN) 5-325 MG per tablet Take 1 tablet by mouth every 6 (six) hours as needed.  30 tablet  0  . levETIRAcetam (KEPPRA) 500 MG tablet Take 1 tablet (500 mg total) by mouth 2 (two) times daily.  60 tablet  2  . lisinopril (PRINIVIL,ZESTRIL) 10 MG  tablet Take 1 tablet (10 mg total) by mouth daily.  30 tablet  1  . methylPREDNISolone (MEDROL, PAK,) 4 MG tablet follow package directions  21 tablet  0   No current facility-administered medications on file prior to visit.    BP 120/74  Pulse 80  Wt 131 lb (59.421 kg)  BMI 27.39 kg/m2  SpO2 98%chart    Objective:   Physical Exam  Constitutional: She is oriented to person, place, and time. She appears well-developed and well-nourished.  HENT:  Head: Normocephalic.  Right Ear: External ear normal.  Left Ear: External ear normal.  Nose: Nose normal.  Mouth/Throat: Oropharynx is clear  and moist.  Eyes: Conjunctivae and EOM are normal. Pupils are equal, round, and reactive to light.  Neck: Normal range of motion. Neck supple.  Cardiovascular: Normal rate, regular rhythm, normal heart sounds and intact distal pulses.   Pulmonary/Chest: Effort normal and breath sounds normal.  Abdominal: Soft. Bowel sounds are normal.  Musculoskeletal: Normal range of motion.  Neurological: She is alert and oriented to person, place, and time. She has normal reflexes.  Skin: Skin is warm and dry.  Psychiatric: She has a normal mood and affect. Her behavior is normal. Judgment and thought content normal.           Assessment & Plan:  Assessment 1.  Post Craniotomy tumor excision 2.  Renal Cell Carcinoma 3.  Mets to Brain and lungs 4.  Left Adrenal Gland Metastasis  5.  HTN  Plan: Support provided for ongoing health concerns with education and discussion regarding side effects of radiation, psychosocial and emotional swings. Orders placed for cholesterol levels with fasting, she will return for lab draw. Return follow up in three month following radiation treatments and oncology consult.

## 2012-07-05 ENCOUNTER — Other Ambulatory Visit: Payer: Medicare Other

## 2012-07-06 ENCOUNTER — Other Ambulatory Visit: Payer: Self-pay | Admitting: Radiation Oncology

## 2012-07-08 ENCOUNTER — Encounter: Payer: Self-pay | Admitting: Radiation Oncology

## 2012-07-12 ENCOUNTER — Telehealth: Payer: Self-pay

## 2012-07-12 ENCOUNTER — Ambulatory Visit
Admission: RE | Admit: 2012-07-12 | Discharge: 2012-07-12 | Disposition: A | Discharge status: Home / Self Care | Payer: Medicare Other | Source: Ambulatory Visit | Attending: Radiation Oncology | Admitting: Radiation Oncology

## 2012-07-12 DIAGNOSIS — C7949 Secondary malignant neoplasm of other parts of nervous system: Secondary | ICD-10-CM

## 2012-07-12 MED ORDER — GADOBENATE DIMEGLUMINE 529 MG/ML IV SOLN
12.0000 mL | Freq: Once | INTRAVENOUS | Status: AC | PRN
  Administered 2012-07-12: 12 mL via INTRAVENOUS

## 2012-07-12 NOTE — Telephone Encounter (Signed)
Patient informed to be here at 8 am for registration, iv start at 8:15 am on 07/13/12.Patient isn't a diabetic, no allergies to contrast dye.BUN and creatinine with in normal limits.

## 2012-07-13 ENCOUNTER — Ambulatory Visit
Admission: RE | Admit: 2012-07-13 | Discharge: 2012-07-13 | Disposition: A | Discharge status: Home / Self Care | Payer: Medicare Other | Source: Ambulatory Visit | Attending: Radiation Oncology | Admitting: Radiation Oncology

## 2012-07-13 VITALS — BP 109/44 | HR 73 | Temp 98.5°F | Wt 133.0 lb

## 2012-07-13 DIAGNOSIS — C7931 Secondary malignant neoplasm of brain: Secondary | ICD-10-CM | POA: Insufficient documentation

## 2012-07-13 DIAGNOSIS — C649 Malignant neoplasm of unspecified kidney, except renal pelvis: Secondary | ICD-10-CM | POA: Insufficient documentation

## 2012-07-13 DIAGNOSIS — C7949 Secondary malignant neoplasm of other parts of nervous system: Secondary | ICD-10-CM

## 2012-07-13 DIAGNOSIS — C799 Secondary malignant neoplasm of unspecified site: Secondary | ICD-10-CM

## 2012-07-13 DIAGNOSIS — D496 Neoplasm of unspecified behavior of brain: Secondary | ICD-10-CM

## 2012-07-13 DIAGNOSIS — Z510 Encounter for antineoplastic radiation therapy: Secondary | ICD-10-CM | POA: Insufficient documentation

## 2012-07-13 MED ORDER — SODIUM CHLORIDE 0.9 % IJ SOLN
10.0000 mL | Freq: Once | INTRAMUSCULAR | Status: AC
  Administered 2012-07-13: 10 mL via INTRAVENOUS

## 2012-07-13 NOTE — Progress Notes (Signed)
22 g 1" cath inserted via right antecube with great bleed return without difficulty.Explained to patient what today's appointment consists of and also informed daughter of approximate length of time appointment lasts.MRI report is back.Will get daughter after completion of simulation for Dr.Moody to review results.Has slight headache that started yesterday.Patient escorted to ct simulation.

## 2012-07-15 ENCOUNTER — Ambulatory Visit
Admission: RE | Admit: 2012-07-15 | Discharge: 2012-07-15 | Disposition: A | Discharge status: Home / Self Care | Payer: Medicare Other | Source: Ambulatory Visit | Attending: Radiation Oncology | Admitting: Radiation Oncology

## 2012-07-15 ENCOUNTER — Encounter: Payer: Self-pay | Admitting: Radiation Oncology

## 2012-07-15 VITALS — BP 130/60 | HR 68 | Temp 97.7°F

## 2012-07-15 DIAGNOSIS — Z923 Personal history of irradiation: Secondary | ICD-10-CM

## 2012-07-15 DIAGNOSIS — C7931 Secondary malignant neoplasm of brain: Secondary | ICD-10-CM

## 2012-07-15 HISTORY — DX: Personal history of irradiation: Z92.3

## 2012-07-15 NOTE — Progress Notes (Signed)
  Radiation Oncology         [phone] ________________________________  Name: Sue Ray MRN: [id number]  Date: 07/15/2012  DOB: 1940/10/10   SPECIAL TREATMENT PROCEDURE   3D TREATMENT PLANNING AND DOSIMETRY: The patient's radiation plan was reviewed and approved by Dr. Danielle Dess from neurosurgery and radiation oncology prior to treatment. It showed 3-dimensional radiation distributions overlaid onto the planning CT/MRI image set. The [facility] for the target structures as well as the organs at risk were reviewed. The documentation of the 3D plan and dosimetry are filed in the radiation oncology EMR.   NARRATIVE: The patient was brought to the TrueBeam stereotactic radiation treatment machine and placed supine on the CT couch. The head frame was applied, and the patient was set up for stereotactic radiosurgery. Neurosurgery was present for the set-up and delivery   SIMULATION VERIFICATION: In the couch zero-angle position, the patient underwent Exactrac imaging using the Brainlab system with orthogonal KV images. These were carefully aligned and repeated to confirm treatment position for each of the isocenters. The Exactrac snap film verification was repeated at each couch angle.   SPECIAL TREATMENT PROCEDURE: The patient received stereotactic radiosurgery to the following targets:  Right parietal  post-operative target was treated using 12 IMRT fields to a prescription dose of 17 Gy. ExacTrac Snap verification was performed for each couch angle.   STEREOTACTIC TREATMENT MANAGEMENT: Following delivery, the patient was transported to nursing in stable condition and monitored for possible acute effects. Vital signs were recorded . The patient tolerated treatment without significant acute effects, and was discharged to home in stable condition.  PLAN: Follow-up in one month.   ------------------------------------------------  Radene Gunning, MD, PhD

## 2012-07-15 NOTE — Progress Notes (Signed)
  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   Narrative: The patient tolerated radiation treatment well.   She exhibited no acute side effects posttreatment.  Plan: The patient has completed radiation treatment. The patient will return to radiation oncology clinic for routine followup in one month. I advised the patient to call or return sooner if they have any questions or concerns related to their recovery or treatment. ________________________________  Radene Gunning, M.D., Ph.D.

## 2012-07-15 NOTE — Progress Notes (Signed)
  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  Identity was confirmed.  All relevant records and images related to the planned course of therapy were reviewed.  The patient freely provided informed written consent to proceed with treatment after reviewing the details related to the planned course of therapy. The consent form was witnessed and verified by the simulation staff. Intravenous access was established for contrast administration. Then, the patient was set-up in a stable reproducible supine position for radiation therapy.  A relocatable thermoplastic stereotactic head frame was fabricated for precise immobilization.  CT images were obtained.  Surface markings were placed.  The CT images were loaded into the planning software and fused with the patient's targeting MRI scan.  Then the target and avoidance structures were contoured.  Treatment planning then occurred.  The radiation prescription was entered and confirmed.  I have requested 3D planning  I have requested a DVH of the following structures: Brain stem, brain, left eye, right I, lenses, optic chiasm, target volumes, uninvolved brain, and normal tissue.    PLAN:  The patient will receive 17 Gy in 1 fraction.  ________________________________  Radene Gunning, MD, PhD

## 2012-07-15 NOTE — Progress Notes (Signed)
Sue Ray in exam room 1.  She denies any any pain, N&V, Blurred vision.  Sitting in recliner with no voiced complaints.

## 2012-07-18 ENCOUNTER — Telehealth: Payer: Self-pay | Admitting: *Deleted

## 2012-07-18 NOTE — Telephone Encounter (Signed)
CALLED PATIENT TO INFORM OF APPT. FOR 07-25-12- ARRIVAL TIME - 3:00 PM, SPOKE WITH PATIENT AND SHE IS AWARE OF THIS APPT.

## 2012-07-22 ENCOUNTER — Other Ambulatory Visit: Payer: Self-pay | Admitting: Medical Oncology

## 2012-07-22 DIAGNOSIS — C7949 Secondary malignant neoplasm of other parts of nervous system: Secondary | ICD-10-CM

## 2012-07-25 ENCOUNTER — Ambulatory Visit (HOSPITAL_BASED_OUTPATIENT_CLINIC_OR_DEPARTMENT_OTHER): Payer: Medicare Other | Admitting: Oncology

## 2012-07-25 ENCOUNTER — Encounter: Payer: Self-pay | Admitting: Oncology

## 2012-07-25 ENCOUNTER — Other Ambulatory Visit (HOSPITAL_BASED_OUTPATIENT_CLINIC_OR_DEPARTMENT_OTHER): Payer: Medicare Other | Admitting: Lab

## 2012-07-25 ENCOUNTER — Ambulatory Visit (HOSPITAL_BASED_OUTPATIENT_CLINIC_OR_DEPARTMENT_OTHER): Payer: Medicare Other

## 2012-07-25 ENCOUNTER — Telehealth: Payer: Self-pay | Admitting: Oncology

## 2012-07-25 VITALS — BP 124/73 | HR 80 | Temp 97.7°F | Resp 20 | Ht <= 58 in | Wt 133.4 lb

## 2012-07-25 DIAGNOSIS — C641 Malignant neoplasm of right kidney, except renal pelvis: Secondary | ICD-10-CM

## 2012-07-25 DIAGNOSIS — C649 Malignant neoplasm of unspecified kidney, except renal pelvis: Secondary | ICD-10-CM

## 2012-07-25 DIAGNOSIS — R5383 Other fatigue: Secondary | ICD-10-CM

## 2012-07-25 DIAGNOSIS — C7931 Secondary malignant neoplasm of brain: Secondary | ICD-10-CM

## 2012-07-25 DIAGNOSIS — R5381 Other malaise: Secondary | ICD-10-CM

## 2012-07-25 DIAGNOSIS — C7949 Secondary malignant neoplasm of other parts of nervous system: Secondary | ICD-10-CM

## 2012-07-25 LAB — COMPREHENSIVE METABOLIC PANEL (CC13)
Albumin: 3.5 g/dL (ref 3.5–5.0)
CO2: 26 mEq/L (ref 22–29)
Glucose: 96 mg/dl (ref 70–99)
Potassium: 4 mEq/L (ref 3.5–5.1)
Sodium: 139 mEq/L (ref 136–145)
Total Protein: 7.7 g/dL (ref 6.4–8.3)

## 2012-07-25 LAB — CBC WITH DIFFERENTIAL/PLATELET
Eosinophils Absolute: 0.2 10*3/uL (ref 0.0–0.5)
MONO#: 0.7 10*3/uL (ref 0.1–0.9)
NEUT#: 3.7 10*3/uL (ref 1.5–6.5)
RBC: 4.71 10*6/uL (ref 3.70–5.45)
RDW: 14.9 % — ABNORMAL HIGH (ref 11.2–14.5)
WBC: 6.8 10*3/uL (ref 3.9–10.3)

## 2012-07-25 MED ORDER — SUNITINIB MALATE 25 MG PO CAPS: ORAL_CAPSULE | ORAL | Status: DC

## 2012-07-25 NOTE — Telephone Encounter (Signed)
appts made and printed for 08/09/12. Made pt aware that cs will call her w/ appts for her testing....td

## 2012-07-25 NOTE — Progress Notes (Signed)
OFFICE PROGRESS NOTE  CC  CAMPBELL, [name], FNP [address] [place] [address] Dr. Dorothy Puffer Dr. 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  PRIOR THERAPY:  #1 in March 2014 patient presented to [facility] with dizziness slurred speech lower extremity edema and balance disturbances. She had CT that revealed brain mass and edema. She underwent a craniotomy on 06/07/2012 by Dr. Barnett Abu. The final pathology revealed renal cell carcinoma.  #2 subsequent staging studies including CT of the chest abdomen and pelvis revealed patient had a right renal cell mass along with some pulmonary metastasis and a left adrenal mass consistent with primary renal cell carcinoma.  #3 patient was seen by radiation oncology and she has now undergone SRS performed on 07/15/2012. Overall she tolerated it well without any problems.  CURRENT THERAPY: Begin systemic therapy with Sutent  INTERVAL HISTORY: Sue Ray 72 y.o. female returns for followup visit post discharge from the hospital. Overall she seems to be doing quite well without any problems. She denies any fevers chills night sweats headaches shortness of breath chest pains palpitations she is weak and tired. She has been gaining weight she has no back pain. She has no difficulty in sleeping no insomnia or depression. Remainder of the 10 point review of systems is negative.  MEDICAL HISTORY: Past Medical History  Diagnosis Date  . Macular degeneration     ALLERGIES:  is allergic to sulfa antibiotics.  MEDICATIONS:  Current Outpatient Prescriptions  Medication Sig Dispense Refill  . levETIRAcetam (KEPPRA) 500 MG tablet Take 1 tablet (500 mg total) by mouth 2 (two) times daily.  60 tablet  2  . lisinopril (PRINIVIL,ZESTRIL) 10 MG tablet Take 1 tablet (10 mg total) by mouth daily.  30 tablet  1  . SUNItinib (SUTENT) 25 MG capsule Take one daily for 4 weeks then  stop for 2 weeks  60 capsule  6   No current facility-administered medications for this visit.    SURGICAL HISTORY:  Past Surgical History  Procedure Laterality Date  . Craniotomy Right 06/07/2012    Procedure: CRANIOTOMY TUMOR EXCISION;  Surgeon: Barnett Abu, MD;  Location: [facility];  Service: Neurosurgery;  Laterality: Right;  Right Parietal Craniotomy for Tumor with Stealth    REVIEW OF SYSTEMS:  Pertinent items are noted in HPI.   HEALTH MAINTENANCE:   PHYSICAL EXAMINATION: Blood pressure 124/73, pulse 80, temperature 97.7 F (36.5 C), temperature source Oral, resp. rate 20, height 4\' 10"  (1.473 m), weight 133 lb 6.4 oz (60.51 kg). Body mass index is 27.89 kg/(m^2). ECOG PERFORMANCE STATUS: 0 - Asymptomatic  Well-developed well-nourished female in no acute distress HEENT exam EOMI PERRLA sclerae anicteric no conjunctival pallor oral mucosa is moist neck is supple lungs clear cardiovascular regular rate rhythm abdomen is soft protuberant there is fullness in the right renal area. Extremities no edema neuro patient's alert oriented no neuro focal deficits.   LABORATORY DATA: Lab Results  Component Value Date   WBC 6.8 07/25/2012   HGB 13.9 07/25/2012   HCT 41.7 07/25/2012   MCV 88.5 07/25/2012   PLT 242 07/25/2012      Chemistry      Component Value Date/Time   NA 139 07/25/2012 1513   NA 141 06/08/2012 0517   K 4.0 07/25/2012 1513   K 4.2 06/08/2012 0517   CL 103 07/25/2012 1513   CL 108 06/08/2012 0517   CO2 26 07/25/2012 1513   CO2  23 06/08/2012 0517   BUN 13.8 07/25/2012 1513   BUN 16 06/08/2012 0517   CREATININE 0.7 07/25/2012 1513   CREATININE 0.58 06/08/2012 0517      Component Value Date/Time   CALCIUM 9.4 07/25/2012 1513   CALCIUM 8.4 06/08/2012 0517   ALKPHOS 84 07/25/2012 1513   ALKPHOS 82 06/05/2012 1604   AST 14 07/25/2012 1513   AST 15 06/05/2012 1604   ALT 11 07/25/2012 1513   ALT 10 06/05/2012 1604   BILITOT 0.25 07/25/2012 1513   BILITOT 0.3 06/05/2012 1604        RADIOGRAPHIC STUDIES:  Mr Laqueta Jean VH Contrast  07/12/2012  *RADIOLOGY REPORT*  Clinical Data: 72 year old female with metastatic renal cell carcinoma.  Right hemisphere brain metastasis status post craniotomy and resection on 06/07/2012.  Stereotactic radiosurgery targeting requested.  MRI HEAD WITHOUT AND WITH CONTRAST  Technique:  Multiplanar, multiecho pulse sequences of the brain and surrounding structures were obtained according to standard protocol without and with intravenous contrast  Contrast: 12mL MULTIHANCE GADOBENATE DIMEGLUMINE 529 MG/ML IV SOLN  Comparison: Preoperative stereotactic CT head with contrast 06/06/2012.  Preoperative brain MRI 06/05/2012.  Findings: Cystic resection site in the right parietal lobe with a small volume circumferential hemosiderin/blood products. Mildly nodular restricted diffusion along the inferomedial resection cavity (series 400 image 24).  Small nodular foci of enhancement along the resection cavity.  See series 10 images 90 - 104, as well as postcontrast sagittal and coronal images.   Overlying craniotomy with a smooth dural thickening and enhancement which is likely postoperative.  Postoperatively, right hemisphere cerebral edema has nearly resolved with a small area of local continued T2 and FLAIR hyperintensity surrounding resection cavity.  Mass effect and midline shift has resolved.  No other abnormal intracranial enhancement is identified.  No ventriculomegaly. Major intracranial vascular flow voids are stable.  Stable mild additional cerebral white matter nonspecific T2 and FLAIR hyperintensity.  Negative pituitary, cervicomedullary junction and visualized cervical spine.  Visualized bone marrow signal is normal.  Postoperative changes to the globes. Visualized paranasal sinuses and mastoids are clear.  No acute scalp soft tissue findings.  IMPRESSION: 1.  Status post resection of right parietal metastasis.  Mild peripheral nodular enhancement at  the cystic resection cavity.  See series 10, series 11 and series 12. And a small focus of restricted diffusion on series 400 image 24 might reflect residual hypercellularity. 2.  No other brain metastasis identified. 3.  Interval resolved intracranial mass effect.  Markedly diminished edema in the right parietal lobe.   Original Report Authenticated By: Erskine Speed, M.D.     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 Patient is status post craniotomy on 06/07/2012 by Dr. Danielle Dess. The final pathology revealed renal cell carcinoma. Postoperatively she has done well. She  #2 patient is also status post SRS by Dr. Dorothy Puffer on 07/15/2012. Overall she tolerated it well and is without any problems.  #3 patient is now recommended systemic palliative treatments with targeted therapies such as Sutent. We discussed rationale of palliative treatment for his renal cell carcinoma. They do understand that there is no cure for this however we certainly could continue to treat her as long as she tolerates treatment and there is no progressive disease. Plan is to begin the Sutent at a medium dose of 25 mg 4 weeks on 2 weeks off. We discussed side effects benefits and rationale.   PLAN:   #1 we will plan  on doing restaging studies with PET CT to evaluate disease stage.  #2 Sutent 25 mg daily 4 weeks on 2 weeks off. The prescription will be sent to specialty pharmacy and we will also get approval from her insurance company.  #3 I will plan on seeing her back on May 27.   All questions were answered. The patient knows to call the clinic with any problems, questions or concerns. We can certainly see the patient much sooner if necessary.  I spent 30 minutes counseling the patient face to face. The total time spent in the appointment was 30 minutes.    Drue Second, MD Medical/Oncology [facility] [phone] (beeper) [phone]  (Office)  07/25/2012, 5:05 PM

## 2012-07-25 NOTE — Telephone Encounter (Signed)
C/D 07/25/12 for appt. 07/25/12

## 2012-07-25 NOTE — Patient Instructions (Addendum)
We discussed diagnosis and treatment of renal cell carcinoma.  Discussed restaging studies including doing  CT PET scan to evaluate for progressive disease.  We discussed sutent use in stage IV renal cell carcinoma. We will order this from specialty pharmacy.  I will plan on seeing you back on 08/09/2012.  Please call with any problems questions or concerns Doyne Keel

## 2012-07-25 NOTE — Progress Notes (Signed)
Checked in new patient. No financial issues. °

## 2012-07-26 ENCOUNTER — Encounter: Payer: Self-pay | Admitting: Oncology

## 2012-07-26 ENCOUNTER — Encounter: Payer: Self-pay | Admitting: *Deleted

## 2012-07-26 NOTE — Progress Notes (Signed)
Faxed sutent prescription to [facility].

## 2012-07-26 NOTE — Progress Notes (Signed)
[employer] [facility], [id number], approved sutent 25mg  from 07/26/12-07/25/13 [id number].

## 2012-07-26 NOTE — Progress Notes (Signed)
RECEIVED A FAX FROM [facility] CONCERNING A PRIOR AUTHORIZATION FOR SUTENT. THIS REQUEST WAS PLACED IN THE MANAGED CARE BIN.

## 2012-07-27 ENCOUNTER — Telehealth: Payer: Self-pay | Admitting: Oncology

## 2012-07-27 ENCOUNTER — Other Ambulatory Visit: Payer: Self-pay | Admitting: Emergency Medicine

## 2012-07-27 DIAGNOSIS — C7949 Secondary malignant neoplasm of other parts of nervous system: Secondary | ICD-10-CM

## 2012-07-27 DIAGNOSIS — N2889 Other specified disorders of kidney and ureter: Secondary | ICD-10-CM

## 2012-07-28 ENCOUNTER — Encounter: Payer: Self-pay | Admitting: Oncology

## 2012-07-28 ENCOUNTER — Telehealth: Payer: Self-pay | Admitting: Medical Oncology

## 2012-07-28 NOTE — Progress Notes (Signed)
I called the patient back. She did need me for an EPP because she said her meds are high. I called her back and advised her I would send her an application for assistance.

## 2012-07-28 NOTE — Telephone Encounter (Signed)
Patient called with concern regarding her prescription for Sutent. States that she cannot afford this medication even after her insurance pay, her portion is $2000.00+ every 28 days "this is not in my budget." Asked to speak with Dr Welton Flakes about an alternative medication. Informed pt that will let MD aware of her concerns. Patient states she wishes to speak with Dr Welton Flakes on the phone.  Informed Raquel Dahlia Client in managed care regarding pt's financial concerns. She stated will contact patient.  Next sched appt with MD 05/27.  Mssg forwarded to MD for review.

## 2012-07-28 NOTE — Progress Notes (Signed)
Patient or someone left message on my vmail. I called her back and she said she called and left for Dr. Welton Flakes to call her back. I told her I would make sure they got the message. Sue Ray is going to call her back.

## 2012-07-29 ENCOUNTER — Other Ambulatory Visit (HOSPITAL_COMMUNITY): Payer: Medicare Other

## 2012-07-29 ENCOUNTER — Ambulatory Visit (HOSPITAL_COMMUNITY)
Admission: RE | Admit: 2012-07-29 | Discharge: 2012-07-29 | Disposition: A | Discharge status: Home / Self Care | Payer: Medicare Other | Source: Ambulatory Visit | Attending: Oncology | Admitting: Oncology

## 2012-07-29 ENCOUNTER — Encounter (HOSPITAL_COMMUNITY): Admission: RE | Admit: 2012-07-29 | Payer: Medicare Other | Source: Ambulatory Visit

## 2012-07-29 DIAGNOSIS — C641 Malignant neoplasm of right kidney, except renal pelvis: Secondary | ICD-10-CM

## 2012-07-29 DIAGNOSIS — I517 Cardiomegaly: Secondary | ICD-10-CM | POA: Insufficient documentation

## 2012-07-29 DIAGNOSIS — C649 Malignant neoplasm of unspecified kidney, except renal pelvis: Secondary | ICD-10-CM | POA: Insufficient documentation

## 2012-07-29 DIAGNOSIS — E041 Nontoxic single thyroid nodule: Secondary | ICD-10-CM | POA: Insufficient documentation

## 2012-07-29 DIAGNOSIS — C7800 Secondary malignant neoplasm of unspecified lung: Secondary | ICD-10-CM | POA: Insufficient documentation

## 2012-07-29 DIAGNOSIS — C7970 Secondary malignant neoplasm of unspecified adrenal gland: Secondary | ICD-10-CM | POA: Insufficient documentation

## 2012-07-29 MED ORDER — IOHEXOL 300 MG/ML  SOLN: 100.0000 mL | Freq: Once | INTRAMUSCULAR | Status: AC | PRN

## 2012-07-29 NOTE — Telephone Encounter (Signed)
Please inform patient that we will do our best to get this medicine for her as a less cost. Speaking to the financial assistance is the first step

## 2012-08-02 ENCOUNTER — Telehealth: Payer: Self-pay | Admitting: Emergency Medicine

## 2012-08-02 NOTE — Telephone Encounter (Signed)
Spoke with patient, gave patient her recent CT results per Dr Welton Flakes.  Patient also inquiring about Sutent and has many questions regarding this medication.  Patient has spoke with Mardelle Matte from the Outpatient Pharmacy and the patient is currently in the process of applying for assistance with [employer] for payment of Sutent.  Patient is also inquiring about the PET scan that was ordered; informed patient that the PET scan was not approved by her insurance company.   Dr Welton Flakes aware of patient's questions and concerns and patient has been reassured that she needs to continue with the process with [employer] to receive the medication.   Patient encouraged to keep all future appointments.

## 2012-08-04 ENCOUNTER — Telehealth: Payer: Self-pay | Admitting: Emergency Medicine

## 2012-08-04 ENCOUNTER — Other Ambulatory Visit: Payer: Self-pay | Admitting: Emergency Medicine

## 2012-08-04 NOTE — Telephone Encounter (Signed)
Scheduled PET scan and called patient to notify her of appointment date and time.  Gave instructions to patient to arrive at [facility] for PET scan and to be NPO after midnight except water.   Patient verbalized understanding.

## 2012-08-09 ENCOUNTER — Ambulatory Visit (HOSPITAL_BASED_OUTPATIENT_CLINIC_OR_DEPARTMENT_OTHER): Payer: Medicare Other | Admitting: Oncology

## 2012-08-09 VITALS — BP 120/69 | HR 80 | Temp 98.4°F | Resp 20 | Ht <= 58 in | Wt 133.9 lb

## 2012-08-09 DIAGNOSIS — C7949 Secondary malignant neoplasm of other parts of nervous system: Secondary | ICD-10-CM

## 2012-08-09 DIAGNOSIS — N289 Disorder of kidney and ureter, unspecified: Secondary | ICD-10-CM

## 2012-08-09 DIAGNOSIS — C50919 Malignant neoplasm of unspecified site of unspecified female breast: Secondary | ICD-10-CM

## 2012-08-09 DIAGNOSIS — N2889 Other specified disorders of kidney and ureter: Secondary | ICD-10-CM

## 2012-08-10 ENCOUNTER — Other Ambulatory Visit (HOSPITAL_COMMUNITY): Payer: Medicare Other

## 2012-08-11 ENCOUNTER — Telehealth: Payer: Self-pay | Admitting: Family

## 2012-08-11 ENCOUNTER — Encounter (HOSPITAL_COMMUNITY)
Admission: RE | Admit: 2012-08-11 | Discharge: 2012-08-11 | Disposition: A | Discharge status: Home / Self Care | Payer: Medicare Other | Source: Ambulatory Visit | Attending: Oncology | Admitting: Oncology

## 2012-08-11 DIAGNOSIS — C641 Malignant neoplasm of right kidney, except renal pelvis: Secondary | ICD-10-CM

## 2012-08-11 DIAGNOSIS — C649 Malignant neoplasm of unspecified kidney, except renal pelvis: Secondary | ICD-10-CM | POA: Insufficient documentation

## 2012-08-11 LAB — GLUCOSE, CAPILLARY: Glucose-Capillary: 92 mg/dL (ref 70–99)

## 2012-08-11 MED ORDER — FLUDEOXYGLUCOSE F - 18 (FDG) INJECTION
18.9000 | Freq: Once | INTRAVENOUS | Status: AC | PRN
  Administered 2012-08-11: 18.9 via INTRAVENOUS

## 2012-08-11 MED ORDER — LISINOPRIL 10 MG PO TABS: 10.0000 mg | ORAL_TABLET | Freq: Every day | ORAL | Status: DC

## 2012-08-11 NOTE — Telephone Encounter (Signed)
Pt needs refill on lisinopril 10 mg call into [facility]. Pt is out of pills

## 2012-08-12 ENCOUNTER — Encounter: Payer: Self-pay | Admitting: Oncology

## 2012-08-15 ENCOUNTER — Ambulatory Visit
Admission: RE | Admit: 2012-08-15 | Discharge: 2012-08-15 | Disposition: A | Discharge status: Home / Self Care | Payer: Medicare Other | Source: Ambulatory Visit | Attending: Radiation Oncology | Admitting: Radiation Oncology

## 2012-08-15 VITALS — BP 101/72 | HR 106 | Temp 98.0°F | Ht <= 58 in | Wt 135.0 lb

## 2012-08-15 DIAGNOSIS — C7949 Secondary malignant neoplasm of other parts of nervous system: Secondary | ICD-10-CM

## 2012-08-15 NOTE — Progress Notes (Signed)
Ms. Sue Ray here with her daughter after treatment to her right parietal lobe.  She is alert and oriented to person, place and time.  She denies pain, dizziness, blurred vision, ataxia and nausea.  She does have fatigue and occasional shortness of breath with activity.

## 2012-08-15 NOTE — Progress Notes (Signed)
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  The patient completed her course of radiosurgery on 07/15/2012. She received postoperative treatment to a right parietal lesion which was resected. She has been planned to begin Sutent medication. This has required some paperwork which sounds like is in its final stages. The patient is eager to begin this treatment. She indicates that she feels relatively well today. Her primary complaint is fatigue as well as some occasional shortness of breath. She remains quite active. She denies any headaches, nausea, or significant vision changes.                              ALLERGIES:  is allergic to sulfa antibiotics.  Meds: Current Outpatient Prescriptions  Medication Sig Dispense Refill  . beta carotene w/minerals (OCUVITE) tablet Take 1 tablet by mouth daily.      Marland Kitchen levETIRAcetam (KEPPRA) 500 MG tablet Take 1 tablet (500 mg total) by mouth 2 (two) times daily.  60 tablet  2  . lisinopril (PRINIVIL,ZESTRIL) 10 MG tablet Take 1 tablet (10 mg total) by mouth daily.  90 tablet  1  . Polyethyl Glycol-Propyl Glycol (SYSTANE) 0.4-0.3 % SOLN Apply 1 drop to eye.      . SUNItinib (SUTENT) 25 MG capsule Take one daily for 4 weeks then stop for 2 weeks  60 capsule  6   No current facility-administered medications for this encounter.    Physical Findings: The patient is in no acute distress. Patient is alert and oriented.  height is 4\' 10"  (1.473 m) and weight is 135 lb (61.236 kg). Her temperature is 98 F (36.7 C). Her blood pressure is 101/72 and her pulse is 106. Her oxygen saturation is 97%. .   General:  Well-developed, in no acute distress HEENT: Normocephalic, atraumatic Cardiovascular: Regular rate and rhythm Respiratory: Clear to auscultation bilaterally GI: Soft, nontender, normal bowel sounds Extremities: No edema present   Lab Findings: Lab Results  Component Value Date   WBC 6.8 07/25/2012   HGB 13.9 07/25/2012   HCT 41.7 07/25/2012   MCV 88.5 07/25/2012   PLT 242 07/25/2012     Radiographic Findings: Ct Chest W Contrast  07/29/2012   *RADIOLOGY REPORT*  Clinical Data:  Renal cell carcinoma. Pulmonary, brain, and left adrenal metastases.  CT CHEST, ABDOMEN AND PELVIS WITH CONTRAST  Technique:  Multidetector CT imaging of the chest, abdomen and pelvis was performed following the standard protocol during bolus administration of intravenous contrast.  Contrast:  100 ml Omnipaque-300  Comparison:  06/06/2012  CT CHEST  Findings:  There is no axillary lymphadenopathy.  Small mediastinal lymph nodes are evident, but there is no mediastinal or hilar lymphadenopathy.  The heart is borderline enlarged.  No pericardial or pleural effusion.  Multiple bilateral pulmonary nodules are evident.  One of the larger nodules is seen in the lingula and measures 7 mm in diameter.  This same nodule was about 7 mm on the previous study. A 9 mm dominant nodule in the central left lower lobe was 9 mm previously.  Bone windows show no worrisome  lytic or sclerotic osseous abnormality in the thorax.  IMPRESSION: Stable appearance of bilateral numerous pulmonary nodules consistent with metastatic disease.  CT ABDOMEN AND PELVIS  Findings:  The liver, spleen, stomach, duodenum, pancreas, gallbladder, and right adrenal gland are unremarkable.  2.2 x 2.2 cm heterogeneously enhancing nodule in the left thyroid lobe is stable.  The large complex enhancing right renal mass with central necrosis measures 9.3 x 7.7 cm today at the same level where it was measured at 9.1 x 7.8 cm previously.  This mass involves the interpolar  region and lower pole.  The left renal vein appears patent.  No evidence for a filling defect in the infrahepatic suprarenal IVC to suggest tumor extension. Renal perfusion and excretion is symmetric.  There is attenuation of the proximal right ureter secondary to mass effect by the lesion, but there is no substantial ureteral obstruction.  Imaging through the pelvis shows no free intraperitoneal fluid. There is no pelvic sidewall lymphadenopathy.  The bladder is normal.  Uterus is unremarkable.  No adnexal mass.  No substantial diverticular disease in the colon.  There is no colonic diverticulitis.  Terminal ileum is normal. The appendix is not visualized, but there is no edema or inflammation in the region of the cecum.  Bone windows reveal no worrisome lytic or sclerotic osseous lesions.  IMPRESSION: No substantial change in the appearance of the large necrotic right renal mass consistent with renal cell carcinoma.  Stable left adrenal metastasis.   Original Report Authenticated By: Kennith Center, M.D.   Ct Abdomen Pelvis W Contrast  07/29/2012   *RADIOLOGY REPORT*  Clinical Data:  Renal cell carcinoma. Pulmonary, brain, and left adrenal metastases.  CT CHEST, ABDOMEN AND PELVIS WITH CONTRAST  Technique:  Multidetector CT imaging of the chest, abdomen and pelvis was performed following the standard protocol during bolus administration of intravenous contrast.  Contrast:  100 ml Omnipaque-300  Comparison:  06/06/2012  CT CHEST  Findings:  There is no axillary lymphadenopathy.  Small mediastinal lymph nodes are evident, but there is no mediastinal or hilar lymphadenopathy.  The heart is borderline enlarged.  No pericardial or pleural effusion.  Multiple bilateral pulmonary nodules are evident.  One of the larger nodules is seen in the lingula and measures 7 mm in diameter.  This same nodule was about 7 mm on the previous study. A 9 mm dominant nodule in the central left lower lobe was 9 mm previously.  Bone  windows show no worrisome lytic or sclerotic osseous abnormality in the thorax.  IMPRESSION: Stable appearance of bilateral numerous pulmonary nodules consistent with metastatic disease.  CT ABDOMEN AND PELVIS  Findings:  The liver, spleen, stomach, duodenum, pancreas, gallbladder, and right adrenal gland are unremarkable.  2.2 x 2.2 cm heterogeneously enhancing nodule in the left thyroid lobe is stable.  The large complex enhancing right renal mass with central necrosis measures 9.3 x 7.7 cm today at the same level where it was measured at 9.1 x 7.8 cm previously.  This mass involves the interpolar region and lower pole.  The left renal vein appears patent.  No evidence for a filling defect in the infrahepatic suprarenal IVC to suggest tumor extension. Renal perfusion and excretion is symmetric.  There is attenuation of the proximal right ureter secondary to mass effect by the lesion, but there is no substantial ureteral obstruction.  Imaging through the pelvis shows no free intraperitoneal fluid. There is no pelvic sidewall lymphadenopathy.  The bladder is normal.  Uterus is  unremarkable.  No adnexal mass.  No substantial diverticular disease in the colon.  There is no colonic diverticulitis.  Terminal ileum is normal. The appendix is not visualized, but there is no edema or inflammation in the region of the cecum.  Bone windows reveal no worrisome lytic or sclerotic osseous lesions.  IMPRESSION: No substantial change in the appearance of the large necrotic right renal mass consistent with renal cell carcinoma.  Stable left adrenal metastasis.   Original Report Authenticated By: Kennith Center, M.D.   Nm Pet Image Initial (pi) Skull Base To Thigh  08/11/2012   *RADIOLOGY REPORT*  Clinical Data: Initial treatment strategy for renal cell carcinoma.  NUCLEAR MEDICINE PET SKULL BASE TO THIGH  Fasting Blood Glucose:  92  Technique:  18.9 mCi F-18 FDG was injected intravenously. CT data was obtained and used for  attenuation correction and anatomic localization only.  (This was not acquired as a diagnostic CT examination.) Additional exam technical data entered on technologist worksheet.  Comparison:  CT on 07/29/2012  Findings:  Neck: No hypermetabolic lymph nodes in the neck.  Chest:  Tiny sub-centimeter bilateral pulmonary metastases are seen on CT images, however none show significant hypermetabolic activity attributable to their small size.  No hypermetabolic lymphadenopathy identified within the thorax.  Abdomen/Pelvis:  The large right renal mass shows diffuse heterogeneous hypermetabolic activity, with maximum SUV measuring 14.0 .  No hypermetabolic retroperitoneal lymph nodes identified. No other hypermetabolic soft tissue masses or lymphadenopathy identified within the abdomen or pelvis.  A 2 cm left adrenal mass is stable in size and shows absence of hypermetabolic activity.  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  Skeleton:  No focal hypermetabolic activity to suggest skeletal metastasis.  IMPRESSION:  1.  Tiny sub-centimeter bilateral pulmonary metastases, which do not show significant hyper metabolism attributable to their small size. 2.  Large hypermetabolic right renal mass, consistent with primary renal cell carcinoma. 3.  2 cm left adrenal mass shows absence of hypermetabolic activity, although its hypervascularity seen on recent CT remains suspicious for adrenal metastasis.  Consider abdomen MRI without contrast for further characterization.   Original Report Authenticated By: Myles Rosenthal, M.D.    Impression:    The patient is doing well 1 month after her course of radiosurgery. She is scheduled to proceed with systemic treatment, initially Sutent medication.  Plan:  The patient will proceed with ongoing observation regarding her a metastasis with a MRI  scan of the brain scheduled in 2 months. Her recent scans including her PET scan did not show any new areas of progressive disease.   Radene Gunning, M.D., Ph.D.

## 2012-08-16 ENCOUNTER — Other Ambulatory Visit: Payer: Self-pay | Admitting: Medical Oncology

## 2012-08-16 MED ORDER — ONDANSETRON HCL 8 MG PO TABS
8.0000 mg | ORAL_TABLET | Freq: Three times a day (TID) | ORAL | Status: DC | PRN
Start: 2012-08-16 — End: 2013-07-24

## 2012-08-16 MED ORDER — PROCHLORPERAZINE MALEATE 10 MG PO TABS: 10.0000 mg | ORAL_TABLET | Freq: Four times a day (QID) | ORAL | Status: DC | PRN

## 2012-08-16 NOTE — Telephone Encounter (Signed)
Patient called to inform MD that she "is getting supent free and will start 08/16/12. I will also need the nausea medication prescription that Dr Welton Flakes was talking about."  Reviewed with MD, prescription for Zofran 8mg  by mouth every 8 hours as needed and compazine 10 mg by mouth every 6 hours as needed escribed to patients pharmacy of choice. Patient given instructions on how to take nausea medications. Patient verbalized understanding, knows to call office with questions or concerns.

## 2012-08-18 ENCOUNTER — Telehealth: Payer: Self-pay | Admitting: Medical Oncology

## 2012-08-18 ENCOUNTER — Encounter: Payer: Self-pay | Admitting: Oncology

## 2012-08-18 ENCOUNTER — Encounter: Payer: Self-pay | Admitting: Medical Oncology

## 2012-08-18 ENCOUNTER — Telehealth: Payer: Self-pay | Admitting: *Deleted

## 2012-08-18 NOTE — Telephone Encounter (Signed)
Patient inquiring as to whether she has any f/u appts to see MD.  L.O.V 08/09/12 with MD  No currently sched appts. Mssg forwarded to MD for review.

## 2012-08-18 NOTE — Progress Notes (Signed)
Patient left message, checking on papers for drug asst. I called her back and advised I would check with nurse on status. She wanted to know if follow-up. I advised her she would have to speak with nurse on visits.

## 2012-08-18 NOTE — Progress Notes (Signed)
Received all back from Dr. I am faxing to [employer]- Attn: Cindy [phone] and received confirmation back from them, for the patient as well as mail the original to the patient today.

## 2012-08-18 NOTE — Telephone Encounter (Signed)
She should follow up with me in 1-2 weeks

## 2012-08-18 NOTE — Telephone Encounter (Signed)
sw pt gv appt for 08/26/12@ 9:45am. Pt is aware...td

## 2012-08-23 ENCOUNTER — Other Ambulatory Visit: Payer: Self-pay | Admitting: Radiation Therapy

## 2012-08-23 DIAGNOSIS — C7949 Secondary malignant neoplasm of other parts of nervous system: Secondary | ICD-10-CM

## 2012-08-26 ENCOUNTER — Telehealth: Payer: Self-pay | Admitting: *Deleted

## 2012-08-26 ENCOUNTER — Ambulatory Visit (HOSPITAL_BASED_OUTPATIENT_CLINIC_OR_DEPARTMENT_OTHER): Payer: Medicare Other | Admitting: Oncology

## 2012-08-26 ENCOUNTER — Encounter: Payer: Self-pay | Admitting: Oncology

## 2012-08-26 ENCOUNTER — Ambulatory Visit (HOSPITAL_BASED_OUTPATIENT_CLINIC_OR_DEPARTMENT_OTHER): Payer: Medicare Other | Admitting: Lab

## 2012-08-26 VITALS — BP 149/75 | HR 80 | Temp 98.4°F | Resp 20 | Ht <= 58 in | Wt 133.9 lb

## 2012-08-26 DIAGNOSIS — N289 Disorder of kidney and ureter, unspecified: Secondary | ICD-10-CM

## 2012-08-26 DIAGNOSIS — C7949 Secondary malignant neoplasm of other parts of nervous system: Secondary | ICD-10-CM

## 2012-08-26 DIAGNOSIS — C7931 Secondary malignant neoplasm of brain: Secondary | ICD-10-CM

## 2012-08-26 DIAGNOSIS — N2889 Other specified disorders of kidney and ureter: Secondary | ICD-10-CM

## 2012-08-26 LAB — COMPREHENSIVE METABOLIC PANEL (CC13)
ALT: 19 U/L (ref 0–55)
AST: 21 U/L (ref 5–34)
Alkaline Phosphatase: 87 U/L (ref 40–150)
CO2: 27 mEq/L (ref 22–29)
Sodium: 140 mEq/L (ref 136–145)
Total Bilirubin: 0.35 mg/dL (ref 0.20–1.20)
Total Protein: 7.6 g/dL (ref 6.4–8.3)

## 2012-08-26 LAB — CBC WITH DIFFERENTIAL/PLATELET
BASO%: 0.2 % (ref 0.0–2.0)
LYMPH%: 43.1 % (ref 14.0–49.7)
MCHC: 33.3 g/dL (ref 31.5–36.0)
MONO#: 0.4 10*3/uL (ref 0.1–0.9)
Platelets: 205 10*3/uL (ref 145–400)
RBC: 5.25 10*6/uL (ref 3.70–5.45)
WBC: 5.5 10*3/uL (ref 3.9–10.3)

## 2012-08-26 NOTE — Telephone Encounter (Signed)
appts made and printed...td 

## 2012-08-26 NOTE — Patient Instructions (Addendum)
Continue sutent daily for a total of 4 weeks, we will see you back in 4 weeks follow up

## 2012-08-26 NOTE — Progress Notes (Signed)
OFFICE PROGRESS NOTE  CC  CAMPBELL, [name], FNP [address] [place] [address] Dr. Dorothy Puffer Dr. 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  PRIOR THERAPY:  #1 in March 2014 patient presented to [facility] with dizziness slurred speech lower extremity edema and balance disturbances. She had CT that revealed brain mass and edema. She underwent a craniotomy on 06/07/2012 by Dr. Barnett Abu. The final pathology revealed renal cell carcinoma.  #2 subsequent staging studies including CT of the chest abdomen and pelvis revealed patient had a right renal cell mass along with some pulmonary metastasis and a left adrenal mass consistent with primary renal cell carcinoma.  #3 patient was seen by radiation oncology and she has now undergone SRS performed on 07/15/2012. Overall she tolerated it well without any problems.  CURRENT THERAPY: Sutent 25 mg daily 4 weeks on one week off starting 08/19/2012  INTERVAL HISTORY: Sue Ray 72 y.o. female returns for followup visit. Overall patient is doing well she is without any complaints she is tolerating Sutent well she denies having any nausea vomiting mucositis diarrhea. She has noticed a little bit of rash on her face but it is tolerable. She is denying having any abdominal pain. She is trying to eat and drink as much as she possibly can. Remainder of the 10 point review of systems is negative.  MEDICAL HISTORY: Past Medical History  Diagnosis Date  . Macular degeneration   . Renal cell carcinoma   . Brain metastasis   . History of radiation therapy 07/15/2012    17 gray to right parietal, post-operative    ALLERGIES:  is allergic to sulfa antibiotics.  MEDICATIONS:  Current Outpatient Prescriptions  Medication Sig Dispense Refill  . beta carotene w/minerals (OCUVITE) tablet Take 1 tablet by mouth daily.      Marland Kitchen levETIRAcetam (KEPPRA) 500 MG tablet Take 1  tablet (500 mg total) by mouth 2 (two) times daily.  60 tablet  2  . lisinopril (PRINIVIL,ZESTRIL) 10 MG tablet Take 1 tablet (10 mg total) by mouth daily.  90 tablet  1  . Polyethyl Glycol-Propyl Glycol (SYSTANE) 0.4-0.3 % SOLN Apply 1 drop to eye.      . SUNItinib (SUTENT) 25 MG capsule Take one daily for 4 weeks then stop for 2 weeks  60 capsule  6  . ondansetron (ZOFRAN) 8 MG tablet Take 1 tablet (8 mg total) by mouth every 8 (eight) hours as needed for nausea.  20 tablet  3  . prochlorperazine (COMPAZINE) 10 MG tablet Take 1 tablet (10 mg total) by mouth every 6 (six) hours as needed.  30 tablet  3   No current facility-administered medications for this visit.    SURGICAL HISTORY:  Past Surgical History  Procedure Laterality Date  . Craniotomy Right 06/07/2012    Procedure: CRANIOTOMY TUMOR EXCISION;  Surgeon: Barnett Abu, MD;  Location: [facility];  Service: Neurosurgery;  Laterality: Right;  Right Parietal Craniotomy for Tumor with Stealth    REVIEW OF SYSTEMS:  Pertinent items are noted in HPI.   HEALTH MAINTENANCE:   PHYSICAL EXAMINATION: Blood pressure 149/75, pulse 80, temperature 98.4 F (36.9 C), temperature source Oral, resp. rate 20, height 4\' 10"  (1.473 m), weight 133 lb 14.4 oz (60.737 kg). Body mass index is 27.99 kg/(m^2). ECOG PERFORMANCE STATUS: 0 - Asymptomatic  Well-developed well-nourished female in no acute distress HEENT exam EOMI PERRLA sclerae anicteric no conjunctival pallor oral mucosa is moist  neck is supple lungs clear cardiovascular regular rate rhythm abdomen is soft protuberant there is fullness in the right renal area. Extremities no edema neuro patient's alert oriented no neuro focal deficits.   LABORATORY DATA: Lab Results  Component Value Date   WBC 6.8 07/25/2012   HGB 13.9 07/25/2012   HCT 41.7 07/25/2012   MCV 88.5 07/25/2012   PLT 242 07/25/2012      Chemistry      Component Value Date/Time   NA 139 07/25/2012 1513   NA 141 06/08/2012  0517   K 4.0 07/25/2012 1513   K 4.2 06/08/2012 0517   CL 103 07/25/2012 1513   CL 108 06/08/2012 0517   CO2 26 07/25/2012 1513   CO2 23 06/08/2012 0517   BUN 13.8 07/25/2012 1513   BUN 16 06/08/2012 0517   CREATININE 0.7 07/25/2012 1513   CREATININE 0.58 06/08/2012 0517      Component Value Date/Time   CALCIUM 9.4 07/25/2012 1513   CALCIUM 8.4 06/08/2012 0517   ALKPHOS 84 07/25/2012 1513   ALKPHOS 82 06/05/2012 1604   AST 14 07/25/2012 1513   AST 15 06/05/2012 1604   ALT 11 07/25/2012 1513   ALT 10 06/05/2012 1604   BILITOT 0.25 07/25/2012 1513   BILITOT 0.3 06/05/2012 1604       RADIOGRAPHIC STUDIES:  Mr Laqueta Jean ZO Contrast  07/12/2012  *RADIOLOGY REPORT*  Clinical Data: 72 year old female with metastatic renal cell carcinoma.  Right hemisphere brain metastasis status post craniotomy and resection on 06/07/2012.  Stereotactic radiosurgery targeting requested.  MRI HEAD WITHOUT AND WITH CONTRAST  Technique:  Multiplanar, multiecho pulse sequences of the brain and surrounding structures were obtained according to standard protocol without and with intravenous contrast  Contrast: 12mL MULTIHANCE GADOBENATE DIMEGLUMINE 529 MG/ML IV SOLN  Comparison: Preoperative stereotactic CT head with contrast 06/06/2012.  Preoperative brain MRI 06/05/2012.  Findings: Cystic resection site in the right parietal lobe with a small volume circumferential hemosiderin/blood products. Mildly nodular restricted diffusion along the inferomedial resection cavity (series 400 image 24).  Small nodular foci of enhancement along the resection cavity.  See series 10 images 90 - 104, as well as postcontrast sagittal and coronal images.   Overlying craniotomy with a smooth dural thickening and enhancement which is likely postoperative.  Postoperatively, right hemisphere cerebral edema has nearly resolved with a small area of local continued T2 and FLAIR hyperintensity surrounding resection cavity.  Mass effect and midline shift has  resolved.  No other abnormal intracranial enhancement is identified.  No ventriculomegaly. Major intracranial vascular flow voids are stable.  Stable mild additional cerebral white matter nonspecific T2 and FLAIR hyperintensity.  Negative pituitary, cervicomedullary junction and visualized cervical spine.  Visualized bone marrow signal is normal.  Postoperative changes to the globes. Visualized paranasal sinuses and mastoids are clear.  No acute scalp soft tissue findings.  IMPRESSION: 1.  Status post resection of right parietal metastasis.  Mild peripheral nodular enhancement at the cystic resection cavity.  See series 10, series 11 and series 12. And a small focus of restricted diffusion on series 400 image 24 might reflect residual hypercellularity. 2.  No other brain metastasis identified. 3.  Interval resolved intracranial mass effect.  Markedly diminished edema in the right parietal lobe.   Original Report Authenticated By: Erskine Speed, M.D.     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 Patient is status post craniotomy on 06/07/2012 by Dr. Danielle Dess. The final  pathology revealed renal cell carcinoma. Postoperatively she has done well. She  #2 patient is also status post SRS by Dr. Dorothy Puffer on 07/15/2012. Overall she tolerated it well and is without any problems.  #3 patient is now recommended systemic palliative treatments with targeted therapies such as Sutent. We discussed rationale of palliative treatment for his renal cell carcinoma. They do understand that there is no cure for this however we certainly could continue to treat her as long as she tolerates treatment and there is no progressive disease. Plan is to begin the Sutent at a medium dose of 25 mg 4 weeks on 2 weeks off. We discussed side effects benefits and rationale.  #4 patient began Sutent one week ago on 08/19/2012. She is on 25 mg daily 4 weeks on 2 weeks off. I did give her a  medium dose to see how she will do. So far she is tolerating it well.  PLAN:   #1 patient will continue Sutent daily since she is tolerating it well. She'll continue the dose for 4 weeks.  #2 she will be seen back in 4 weeks' time for followup with lab.  #3 she knows to call with any problems questions or concerns in the meantime.   All questions were answered. The patient knows to call the clinic with any problems, questions or concerns. We can certainly see the patient much sooner if necessary.  I spent 30 minutes counseling the patient face to face. The total time spent in the appointment was 30 minutes.    Drue Second, MD Medical/Oncology [facility] [phone] (beeper) [phone] (Office)  08/26/2012, 10:38 AM

## 2012-08-29 ENCOUNTER — Encounter: Payer: Self-pay | Admitting: Oncology

## 2012-08-29 NOTE — Progress Notes (Signed)
[employer] had called on directions for the patient's Sutent. I called and gave to them. [phone] ext [phone]

## 2012-09-01 ENCOUNTER — Emergency Department (HOSPITAL_COMMUNITY)
Admission: EM | Admit: 2012-09-01 | Discharge: 2012-09-01 | Disposition: A | Discharge status: Home / Self Care | Payer: Medicare Other | Attending: Emergency Medicine | Admitting: Emergency Medicine

## 2012-09-01 ENCOUNTER — Encounter (HOSPITAL_COMMUNITY): Payer: Self-pay | Admitting: Emergency Medicine

## 2012-09-01 ENCOUNTER — Emergency Department (HOSPITAL_COMMUNITY): Payer: Medicare Other

## 2012-09-01 ENCOUNTER — Telehealth: Payer: Self-pay | Admitting: *Deleted

## 2012-09-01 DIAGNOSIS — Z5111 Encounter for antineoplastic chemotherapy: Secondary | ICD-10-CM | POA: Insufficient documentation

## 2012-09-01 DIAGNOSIS — C7900 Secondary malignant neoplasm of unspecified kidney and renal pelvis: Secondary | ICD-10-CM | POA: Insufficient documentation

## 2012-09-01 DIAGNOSIS — Z79899 Other long term (current) drug therapy: Secondary | ICD-10-CM | POA: Insufficient documentation

## 2012-09-01 DIAGNOSIS — F17200 Nicotine dependence, unspecified, uncomplicated: Secondary | ICD-10-CM | POA: Insufficient documentation

## 2012-09-01 DIAGNOSIS — R059 Cough, unspecified: Secondary | ICD-10-CM | POA: Insufficient documentation

## 2012-09-01 DIAGNOSIS — Z8669 Personal history of other diseases of the nervous system and sense organs: Secondary | ICD-10-CM | POA: Insufficient documentation

## 2012-09-01 DIAGNOSIS — Z923 Personal history of irradiation: Secondary | ICD-10-CM | POA: Insufficient documentation

## 2012-09-01 DIAGNOSIS — C799 Secondary malignant neoplasm of unspecified site: Secondary | ICD-10-CM

## 2012-09-01 DIAGNOSIS — R509 Fever, unspecified: Secondary | ICD-10-CM | POA: Insufficient documentation

## 2012-09-01 DIAGNOSIS — Z85841 Personal history of malignant neoplasm of brain: Secondary | ICD-10-CM | POA: Insufficient documentation

## 2012-09-01 DIAGNOSIS — R05 Cough: Secondary | ICD-10-CM | POA: Insufficient documentation

## 2012-09-01 DIAGNOSIS — C801 Malignant (primary) neoplasm, unspecified: Secondary | ICD-10-CM | POA: Insufficient documentation

## 2012-09-01 LAB — COMPREHENSIVE METABOLIC PANEL
ALT: 25 U/L (ref 0–35)
AST: 35 U/L (ref 0–37)
Albumin: 3.6 g/dL (ref 3.5–5.2)
Alkaline Phosphatase: 80 U/L (ref 39–117)
CO2: 24 mEq/L (ref 19–32)
Chloride: 92 mEq/L — ABNORMAL LOW (ref 96–112)
Creatinine, Ser: 0.69 mg/dL (ref 0.50–1.10)
GFR calc non Af Amer: 84 mL/min — ABNORMAL LOW (ref >90)
Potassium: 3.1 mEq/L — ABNORMAL LOW (ref 3.5–5.1)
Total Bilirubin: 0.4 mg/dL (ref 0.3–1.2)

## 2012-09-01 LAB — CBC WITH DIFFERENTIAL/PLATELET
Eosinophils Relative: 2 % (ref 0–5)
HCT: 45 % (ref 36.0–46.0)
Lymphocytes Relative: 26 % (ref 12–46)
Lymphs Abs: 1 10*3/uL (ref 0.7–4.0)
MCV: 86.7 fL (ref 78.0–100.0)
Monocytes Relative: 3 % (ref 3–12)
Platelets: 106 10*3/uL — ABNORMAL LOW (ref 150–400)
RBC: 5.19 MIL/uL — ABNORMAL HIGH (ref 3.87–5.11)
WBC: 3.9 10*3/uL — ABNORMAL LOW (ref 4.0–10.5)

## 2012-09-01 LAB — URINE MICROSCOPIC-ADD ON

## 2012-09-01 LAB — URINALYSIS, ROUTINE W REFLEX MICROSCOPIC
Bilirubin Urine: NEGATIVE
Glucose, UA: NEGATIVE mg/dL
Ketones, ur: NEGATIVE mg/dL
Protein, ur: NEGATIVE mg/dL
Urobilinogen, UA: 1 mg/dL (ref 0.0–1.0)

## 2012-09-01 LAB — CG4 I-STAT (LACTIC ACID): Lactic Acid, Venous: 3.28 mmol/L — ABNORMAL HIGH (ref 0.5–2.2)

## 2012-09-01 MED ORDER — SODIUM CHLORIDE 0.9 % IV BOLUS (SEPSIS)
1000.0000 mL | Freq: Once | INTRAVENOUS | Status: AC
  Administered 2012-09-01: 1000 mL via INTRAVENOUS

## 2012-09-01 NOTE — ED Provider Notes (Signed)
History     CSN: [id number]  Arrival date & time 09/01/12  1627   First MD Initiated Contact with Patient 09/01/12 1636      Chief Complaint  Patient presents with  . Fever  . Cough    (Consider location/radiation/quality/duration/timing/severity/associated sxs/prior treatment) The history is provided by the patient.   patient reports intermittent fever over the past 24 hours.  She has a history of metastatic renal cell carcinoma and is currently on chemotherapy.  She called the oncology office today and the recommendation emerged from her for evaluation.  She reports is that she's had some pressure in her upper sinuses.  She denies sore throat.  No ear aches.  No shortness of breath.  She has had mild cough.  She denies abdominal pain.  No nausea vomiting or diarrhea.  No urinary complaints.  Her symptoms are mild in severity.  She states she overall feels well.  Her fever was 101.7 at home and she took Aleve.  On arrival to emergency apartment her oral temperature is 98.3.  She feels much better.  Past Medical History  Diagnosis Date  . Macular degeneration   . Renal cell carcinoma   . Brain metastasis   . History of radiation therapy 07/15/2012    17 gray to right parietal, post-operative    Past Surgical History  Procedure Laterality Date  . Craniotomy Right 06/07/2012    Procedure: CRANIOTOMY TUMOR EXCISION;  Surgeon: Barnett Abu, MD;  Location: [facility];  Service: Neurosurgery;  Laterality: Right;  Right Parietal Craniotomy for Tumor with Stealth    Family History  Problem Relation Age of Onset  . Brain cancer Neg Hx     History  Substance Use Topics  . Smoking status: Current Every Day Smoker    Types: Cigarettes  . Smokeless tobacco: Not on file  . Alcohol Use: Yes    OB History   Grav Para Term Preterm Abortions TAB SAB Ect Mult Living                  Review of Systems  All other systems reviewed and are negative.    Allergies  Sulfa  antibiotics  Home Medications   Current Outpatient Rx  Name  Route  Sig  Dispense  Refill  . beta carotene w/minerals (OCUVITE) tablet   Oral   Take 1 tablet by mouth daily.         Marland Kitchen levETIRAcetam (KEPPRA) 500 MG tablet   Oral   Take 1 tablet (500 mg total) by mouth 2 (two) times daily.   60 tablet   2   . lisinopril (PRINIVIL,ZESTRIL) 10 MG tablet   Oral   Take 1 tablet (10 mg total) by mouth daily.   90 tablet   1   . Polyethyl Glycol-Propyl Glycol (SYSTANE) 0.4-0.3 % SOLN   Ophthalmic   Apply 1 drop to eye.         . SUNItinib (SUTENT) 25 MG capsule      Take one daily for 4 weeks then stop for 2 weeks   60 capsule   6   . ondansetron (ZOFRAN) 8 MG tablet   Oral   Take 1 tablet (8 mg total) by mouth every 8 (eight) hours as needed for nausea.   20 tablet   3   . prochlorperazine (COMPAZINE) 10 MG tablet   Oral   Take 1 tablet (10 mg total) by mouth every 6 (six) hours as needed.  30 tablet   3     BP 116/72  Pulse 103  Resp 16  SpO2 97%  Physical Exam  Nursing note and vitals reviewed. Constitutional: She is oriented to person, place, and time. She appears well-developed and well-nourished. No distress.  HENT:  Head: Normocephalic and atraumatic.  Bilateral TMs are normal.  Posterior pharynx is without erythema or swelling.  No tonsillar exudate  Eyes: EOM are normal.  Neck: Normal range of motion.  No meningeal signs  Cardiovascular: Normal rate, regular rhythm and normal heart sounds.   Pulmonary/Chest: Effort normal and breath sounds normal.  Abdominal: Soft. She exhibits no distension. There is no tenderness.  Musculoskeletal: Normal range of motion.  Neurological: She is alert and oriented to person, place, and time.  Skin: Skin is warm and dry.  No petechia  Psychiatric: She has a normal mood and affect. Judgment normal.    ED Course  Procedures (including critical care time)  Labs Reviewed  CBC WITH DIFFERENTIAL - Abnormal;  Notable for the following:    WBC 3.9 (*)    RBC 5.19 (*)    Hemoglobin 15.6 (*)    Platelets 106 (*)    All other components within normal limits  COMPREHENSIVE METABOLIC PANEL - Abnormal; Notable for the following:    Sodium 129 (*)    Potassium 3.1 (*)    Chloride 92 (*)    Glucose, Bld 142 (*)    GFR calc non Af Amer 84 (*)    All other components within normal limits  URINALYSIS, ROUTINE W REFLEX MICROSCOPIC - Abnormal; Notable for the following:    Color, Urine AMBER (*)    Hgb urine dipstick TRACE (*)    All other components within normal limits  CG4 I-STAT (LACTIC ACID) - Abnormal; Notable for the following:    Lactic Acid, Venous 3.28 (*)    All other components within normal limits  URINE CULTURE  CULTURE, BLOOD (ROUTINE X 2)  CULTURE, BLOOD (ROUTINE X 2)  URINE MICROSCOPIC-ADD ON   No results found.   No diagnosis found.    MDM  Patient is overall very well appearing.  Her vital signs are normal.  She is not neutropenic.  I spoke with on-call oncology Dr. Darnelle Catalan, who recommends discharge him from the emergency department without antibiotics.  He recommends following up in the oncology office tomorrow.  Blood and urine cultures pending.  Patient's point-of-care lactic acid is 2.28 however with normal vital signs and no serious appearing illness I do not think this is significantly relevant.  She understands return the emergency permit for new or worsening symptoms.  Is discharged instructions were discussed with her daughter and all questions were answered.  She is overall very well appearing        Lyanne Co, MD 09/01/12 2010

## 2012-09-01 NOTE — Telephone Encounter (Signed)
VERBAL ORDER AND READ BACK TO DR.KHAN- HAVE PT. STOP SUTENT. SHE NEEDS TO GO TO THE EMERGENCY ROOM TO BE EVALUATED. NOTIFIED PT.'S DAUGHTER, SUSIE, OF THE ABOVE INFORMATION. SHE VOICES UNDERSTANDING. PT.'S DAUGHTER WILL CALL LATER WITH AN UPDATE ON HER MOTHER'S CONDITION.

## 2012-09-01 NOTE — Telephone Encounter (Signed)
DAUGHTER GAVE PT. ALEVE WHICH BROUGHT HER TEMPERATURE DOWN TO NORMAL. TODAY PT. TOOK HER SUTENT AND NOW HAS A FEVER OF 101.7. HER EYES ARE WATERING. SHE IS VERY WEAK AND FEELS "LOUSY". NO PAIN, SHORTNESS OF BREATH, NAUSEA, VOMITING, OR DIARRHEA. DAUGHTER HAS GIVEN PT. ALEVE. THIS NOTE WAS GIVEN TO DR.KHAN.

## 2012-09-01 NOTE — ED Notes (Signed)
Pt states she is on chemo medication at home.  Pt states she began to have a fever yesterday.  Called MD and was told to come here.  Pt states she has a cough.  Pt has kidney ca with mets to brain.

## 2012-09-02 ENCOUNTER — Ambulatory Visit (HOSPITAL_BASED_OUTPATIENT_CLINIC_OR_DEPARTMENT_OTHER): Payer: Medicare Other | Admitting: Family

## 2012-09-02 ENCOUNTER — Encounter: Payer: Self-pay | Admitting: Family

## 2012-09-02 ENCOUNTER — Telehealth: Payer: Self-pay | Admitting: *Deleted

## 2012-09-02 ENCOUNTER — Ambulatory Visit (HOSPITAL_BASED_OUTPATIENT_CLINIC_OR_DEPARTMENT_OTHER): Payer: Medicare Other

## 2012-09-02 VITALS — BP 107/69 | HR 98 | Temp 97.7°F | Resp 20 | Ht <= 58 in | Wt 137.7 lb

## 2012-09-02 VITALS — BP 113/77 | HR 98 | Temp 98.3°F | Resp 24

## 2012-09-02 DIAGNOSIS — R509 Fever, unspecified: Secondary | ICD-10-CM

## 2012-09-02 DIAGNOSIS — C7931 Secondary malignant neoplasm of brain: Secondary | ICD-10-CM

## 2012-09-02 DIAGNOSIS — R5383 Other fatigue: Secondary | ICD-10-CM

## 2012-09-02 DIAGNOSIS — C649 Malignant neoplasm of unspecified kidney, except renal pelvis: Secondary | ICD-10-CM

## 2012-09-02 DIAGNOSIS — E876 Hypokalemia: Secondary | ICD-10-CM

## 2012-09-02 DIAGNOSIS — R197 Diarrhea, unspecified: Secondary | ICD-10-CM

## 2012-09-02 DIAGNOSIS — C799 Secondary malignant neoplasm of unspecified site: Secondary | ICD-10-CM

## 2012-09-02 LAB — URINE CULTURE

## 2012-09-02 MED ORDER — SODIUM CHLORIDE 0.9 % IJ SOLN
10.0000 mL | Freq: Once | INTRAMUSCULAR | Status: DC
  Filled 2012-09-02: qty 10

## 2012-09-02 MED ORDER — POTASSIUM CHLORIDE CRYS ER 20 MEQ PO TBCR
20.0000 meq | EXTENDED_RELEASE_TABLET | Freq: Every day | ORAL | Status: AC
  Administered 2012-09-02: 20 meq via ORAL
  Filled 2012-09-02: qty 1

## 2012-09-02 MED ORDER — SODIUM CHLORIDE 0.9 % IV SOLN
Freq: Every day | INTRAVENOUS | Status: AC
  Administered 2012-09-02: 17:00:00 via INTRAVENOUS

## 2012-09-02 MED ORDER — HEPARIN (PORCINE) LOCK FLUSH 10 UNIT/ML IV SOLN: 10.0000 [IU] | Freq: Once | INTRAVENOUS | Status: DC

## 2012-09-02 NOTE — Telephone Encounter (Signed)
PT.'S DAUGHTER TOOK PT. TO EMERGENCY ROOM YESTERDAY. PT.'S DAUGHTER WAS INSTRUCTED TO CALL DR.KHAN'S OFFICE TODAY SO PT. COULD BE SEEN BY DR.KHAN TODAY. PT.'S KNEES ARE ALSO HURTING. THIS NOTE TO DR.KHAN'S NURSE, MIRA LEONETTI,RN.

## 2012-09-02 NOTE — Patient Instructions (Addendum)
Please contact us at [phone] if you have any questions or concerns.  Get plenty of rest, drink plenty of water (or Gatorade) daily, exercise daily (walking, eat a bland diet (BRAT) and advance as tolerated (avoid rich/saucy/fried foods)  Practice good oral hygiene use Biotene as directed and the "Natural Dentist" as directed.  Stop drinking green tea until you advance your diet and/or recover fully.

## 2012-09-02 NOTE — Progress Notes (Signed)
1750 -  Pt here for IVF.  Pt developed shaking chills, denied SOB, A&O x 3.  VS taken -  Temp 98.3, P 98, R 24, BP 113/77.  Family at chair side.  Annice Pih, NP notified.  No new order.  Explanations given to pt and daughter.

## 2012-09-02 NOTE — Telephone Encounter (Signed)
Patient's daughter called needing f/u appt for ED visit from yesterday d/t fever. Per Dr Welton Flakes patient to see Norina Buzzard, NP today at next avail. Onc tx sent, sched to notify patient.

## 2012-09-02 NOTE — Progress Notes (Addendum)
[facility]  Telephone:([phone] Fax:([phone]  OFFICE PROGRESS NOTE  Ray: Sue Ray   DOB: [birth year]  MR#: [id number]  ION#:[id number]   KG:[id number], PADONDA BOYD, FNP Dorothy Puffer, M.D. Stefani Dama, M.D.  DIAGNOSIS: Sue Ray is a 72 year old [place], [place] woman with metastatic renal cell carcinoma with brain metastasis.  PRIOR THERAPY: 1. In 05/2012 Sue Ray presented to [facility] with complaints of dizziness, slurred speech, lower extremity, edema and balance disturbances.  A CT of Sue brain revealed a brain mass and edema. She underwent a craniotomy on 06/07/2012 by Dr. Barnett Abu. Sue final pathology revealed renal cell carcinoma.   2. Subsequent staging studies including CT of Sue chest and abdomen/pelvis revealed Ray had a right renal cell mass along with  pulmonary metastasis and a left adrenal mass consistent with primary renal cell carcinoma.   3.  Sue Ray was seen by radiation oncology and has undergone SRS performed on 07/15/2012. Overall she tolerated it well without any problems.  4.  Sue Ray started taking oral chemotherapy with Sutent on 08/16/2012.  Sutent is prescribed at 25 mg by mouth daily for 4 weeks on with 2 weeks off.  Three weeks into taking this medication, Sue Ray presented to Sue emergency room on 09/01/2012 complaining of fever at home registering at 101.7.  She was discharged from Sue ER with a normal temperature and told to followup with her oncologist.   CURRENT THERAPY: Sutent is being held at this time.   INTERVAL HISTORY: Dr. Welton Flakes and I saw Sue Ray  today for followup of renal cell carcinoma with recent chemotherapy start of Sutent.  She is accompanied for today's office visit by her son Gregary Signs and daughter Lynnell Dike.  Her last office visit with Welton Flakes was on 08/26/2012 .  Since her last office visit Sue Ray and her family report that in Sue last 3 days she has developed a  fever (101.2 today before taking Aleve), loose stools (once daily x 3 days), fatigue, lower extremity edema, confusion/memory loss, shortness of breath (without exertion), taste alteration, joint pains, and a.m. eye watering since beginning Sutent medication 08/16/2012.   Sue Ray and her family deny any other symptomatology.  Sue Ray was instructed to followup with her oncologist after being discharged from Sue ER yesterday.   PAST MEDICAL HISTORY: Past Medical History  Diagnosis Date  . Macular degeneration   . Renal cell carcinoma   . Brain metastasis   . History of radiation therapy 07/15/2012    17 gray to right parietal, post-operative    PAST SURGICAL HISTORY: Past Surgical History  Procedure Laterality Date  . Craniotomy Right 06/07/2012    Procedure: CRANIOTOMY TUMOR EXCISION;  Surgeon: Barnett Abu, MD;  Location: [facility];  Service: Neurosurgery;  Laterality: Right;  Right Parietal Craniotomy for Tumor with Stealth    FAMILY HISTORY: Family History  Problem Relation Age of Onset  . Brain cancer Neg Hx   . Hypertension Brother   . Hyperlipidemia Brother   . Parkinson's disease Brother   . Cancer Brother     Lung cancer  . Heart attack Brother     SOCIAL HISTORY: History  Substance Use Topics  . Smoking status: Current Every Day Smoker    Types: Cigarettes  . Smokeless tobacco: Never Used  . Alcohol Use: Yes     Comment: Rare    ALLERGIES: Allergies  Allergen Reactions  . Sulfa Antibiotics Swelling    nervous  MEDICATIONS:  Current Outpatient Prescriptions  Medication Sig Dispense Refill  . beta carotene w/minerals (OCUVITE) tablet Take 1 tablet by mouth daily.      Marland Kitchen levETIRAcetam (KEPPRA) 500 MG tablet Take 1 tablet (500 mg total) by mouth 2 (two) times daily.  60 tablet  2  . lisinopril (PRINIVIL,ZESTRIL) 10 MG tablet Take 1 tablet (10 mg total) by mouth daily.  90 tablet  1  . Polyethyl Glycol-Propyl Glycol (SYSTANE) 0.4-0.3 % SOLN  Apply 1 drop to eye.      . prochlorperazine (COMPAZINE) 10 MG tablet Take 1 tablet (10 mg total) by mouth every 6 (six) hours as needed.  30 tablet  3  . ondansetron (ZOFRAN) 8 MG tablet Take 1 tablet (8 mg total) by mouth every 8 (eight) hours as needed for nausea.  20 tablet  3  . SUNItinib (SUTENT) 25 MG capsule Take one daily for 4 weeks then stop for 2 weeks  60 capsule  6   Current Facility-Administered Medications  Medication Dose Route Frequency Provider Last Rate Last Dose  . 0.9 %  sodium chloride infusion   Intravenous Daily Keitha Butte, NP      . potassium chloride SA (K-DUR,KLOR-CON) CR tablet 20 mEq  20 mEq Oral Daily Keitha Butte, NP   20 mEq at 09/02/12 1713   Facility-Administered Medications Ordered in Other Visits  Medication Dose Route Frequency Provider Last Rate Last Dose  . heparin flush 10 UNIT/ML injection 10 Units  10 Units Intracatheter Once Keitha Butte, NP      . sodium chloride 0.9 % injection 10 mL  10 mL Intracatheter Once Keitha Butte, NP          REVIEW OF SYSTEMS: A 10 point review of systems was completed and is negative except as noted above.    PHYSICAL EXAMINATION: BP 107/69  Pulse 98  Temp(Src) 97.7 F (36.5 C) (Axillary)  Resp 20  Ht 4\' 10"  (1.473 m)  Wt 137 lb 11.2 oz (62.46 kg)  BMI 28.79 kg/m2  O2 saturations on room air 96%  General appearance: Alert, cooperative, well nourished, mild distress Head: Normocephalic, without obvious abnormality, atraumatic Eyes: Arcus senilis, PERRLA, EOMI, occasional right eye amblyopia Nose: Nares, septum and mucosa are normal, no drainage or sinus tenderness Neck: No adenopathy, supple, symmetrical, trachea midline, thyroid not enlarged, no tenderness Resp: Clear to auscultation bilaterally, diminished bibasilar breath sounds Cardio: Regular rate and rhythm, S1, S2 normal, no murmur, click, rub or gallop GI: Soft, distended, non-tender, normoactive bowel sounds, no  organomegaly, right lower quadrant protuberance Extremities: Extremities normal, atraumatic, no cyanosis, bilateral lower extremity varicose veins, bilateral generalized foot edema 1+ Lymph nodes: Cervical, supraclavicular, and axillary nodes normal Neurologic: Grossly normal, 5/5 strength in all extremities, Sue Ray answers questions appropriately, cranial nerves II - XII intact, no focal deficits   ECOG FS:  1 - Symptomatic but completely ambulatory   LAB RESULTS: Lab Results  Component Value Date   WBC 3.9* 09/01/2012   NEUTROABS 2.7 09/01/2012   HGB 15.6* 09/01/2012   HCT 45.0 09/01/2012   MCV 86.7 09/01/2012   PLT 106* 09/01/2012      Chemistry      Component Value Date/Time   NA 129* 09/01/2012 1719   NA 140 08/26/2012 1124   K 3.1* 09/01/2012 1719   K 4.2 08/26/2012 1124   CL 92* 09/01/2012 1719   CL 103 08/26/2012 1124   CO2 24 09/01/2012 1719   CO2  27 08/26/2012 1124   BUN 10 09/01/2012 1719   BUN 10.0 08/26/2012 1124   CREATININE 0.69 09/01/2012 1719   CREATININE 0.8 08/26/2012 1124      Component Value Date/Time   CALCIUM 9.1 09/01/2012 1719   CALCIUM 9.7 08/26/2012 1124   ALKPHOS 80 09/01/2012 1719   ALKPHOS 87 08/26/2012 1124   AST 35 09/01/2012 1719   AST 21 08/26/2012 1124   ALT 25 09/01/2012 1719   ALT 19 08/26/2012 1124   BILITOT 0.4 09/01/2012 1719   BILITOT 0.35 08/26/2012 1124      No results found for this basename: LABCA2    No components found with this basename: [id number]     RADIOGRAPHIC STUDIES: Dg Chest 2 View 09/01/2012   *RADIOLOGY REPORT*  Clinical Data: Fever, cough  CHEST - 2 VIEW  Comparison: PET CT 08/11/2012 and earlier studies  Findings: Mild cardiomegaly.  Relatively low lung volumes with resultant crowding of perihilar and bibasilar bronchovascular structures.  No confluent airspace infiltrate.  Diffuse osteopenia. No effusion. Sue small pulmonary nodules seen on Sue previous study are inapparent.  IMPRESSION:  1.  Low volumes.  No definite acute  disease.   Original Report Authenticated By: D. Andria Rhein, MD   Nm Pet Image Initial (pi) Skull Base To Thigh 08/11/2012   *RADIOLOGY REPORT*  Clinical Data: Initial treatment strategy for renal cell carcinoma.  NUCLEAR MEDICINE PET SKULL BASE TO THIGH  Fasting Blood Glucose:  92  Technique:  18.9 mCi F-18 FDG was injected intravenously. CT data was obtained and used for attenuation correction and anatomic localization only.  (This was not acquired as a diagnostic CT examination.) Additional exam technical data entered on technologist worksheet.  Comparison:  CT on 07/29/2012  Findings:  Neck: No hypermetabolic lymph nodes in Sue neck.  Chest:  Tiny sub-centimeter bilateral pulmonary metastases are seen on CT images, however none show significant hypermetabolic activity attributable to their small size.  No hypermetabolic lymphadenopathy identified within Sue thorax.  Abdomen/Pelvis:  Sue large right renal mass shows diffuse heterogeneous hypermetabolic activity, with maximum SUV measuring 14.0 .  No hypermetabolic retroperitoneal lymph nodes identified. No other hypermetabolic soft tissue masses or lymphadenopathy identified within Sue abdomen or pelvis.  A 2 cm left adrenal mass is stable in size and shows absence of hypermetabolic activity.  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  Skeleton:  No focal hypermetabolic activity to suggest skeletal metastasis.  IMPRESSION:  1.  Tiny sub-centimeter bilateral pulmonary metastases, which do not show significant hyper metabolism attributable to their small size. 2.  Large hypermetabolic right renal mass, consistent with primary renal cell carcinoma. 3.  2 cm left adrenal mass shows absence of hypermetabolic activity, although its hypervascularity seen on recent CT remains suspicious for adrenal metastasis.  Consider  abdomen MRI without contrast for further characterization.   Original Report Authenticated By: Myles Rosenthal, M.D.    ASSESSMENT: 72 y.o. [place], [place]: 1.  Metastatic renal cell carcinoma with brain metastasis. Ray is status post craniotomy on 06/07/2012 by Dr. Danielle Dess. Sue final pathology revealed renal cell carcinoma. Postoperatively she has done well.  2.  Status post SRS by Dr. Dorothy Puffer on 07/15/2012.  She tolerated therapy well.  3. Targeted systemic palliative therapy is recommended such as with Sutent.  Dr. Welton Flakes has previously discussed rationale of palliative treatment for her renal cell  carcinoma.  Possible side effects, benefits and rationale of this medication were discussed with Sue Ray and her family.  Sue Ray and her family understand that there is no cure for her metastatic renal cell carcinoma, however we certainly could continue to treat her as long as she tolerates treatment and there is no disease progression.   Sutent was started on 08/16/2012 at a medium dose of 25 mg 4 weeks on 2 weeks off.  Sue Ray has had an acute reaction to Sue medication, and Sutent is being held at this time.  4.  Hypokalemia  PLAN: 1.  Sutent medication is being held at this time.  Sue Ray is scheduled to receive IV fluid resuscitation x 1 L for Sue next 2 days.  We have also asked her to consume a BRAT diet, keep well hydrated with (electrolyte replacement), and to be hypervigilant about oral hygiene.  Written BRAT diet instructions were provided to Sue Ray and her family. Sue Ray and her family have voiced agreement and understanding.    2.  Sue Ray will be given 20 mEq of PO potassium chloride during today's and tomorrow's IVF infusions.  Sue Ray is asked to consume a diet rich in potassium for Sue next week.  A written list of high potassium foods was provided to Sue Ray and her family.  3.  We plan to see Sue Ray again in 4 days, and we  will check a CBC and CMP at that time.  All questions were answered.  Sue Ray and her family were encouraged to contact us in Sue interim with any problems, questions or concerns.    Larina Bras, NP-C 09/02/2012, 7:51 PM   ATTENDING'S ATTESTATION:  I personally reviewed Ray's chart, examined Ray myself, formulated Sue treatment plan as followed.    A separate note has transcribed by me on this Ray visit.  Drue Second, MD Medical/Oncology [facility] [phone] (beeper) [phone] (Office)

## 2012-09-02 NOTE — Telephone Encounter (Signed)
CALLED PT.'S DAUGHTER WITH APPOINTMENT TIME FOR TODAY.

## 2012-09-02 NOTE — Patient Instructions (Addendum)
Dehydration, Adult Dehydration is when you lose more fluids from the body than you take in. Vital organs like the kidneys, brain, and heart cannot function without a proper amount of fluids and salt. Any loss of fluids from the body can cause dehydration.  CAUSES   Vomiting.  Diarrhea.  Excessive sweating.  Excessive urine output.  Fever. SYMPTOMS  Mild dehydration  Thirst.  Dry lips.  Slightly dry mouth. Moderate dehydration  Very dry mouth.  Sunken eyes.  Skin does not bounce back quickly when lightly pinched and released.  Dark urine and decreased urine production.  Decreased tear production.  Headache. Severe dehydration  Very dry mouth.  Extreme thirst.  Rapid, weak pulse (more than 100 beats per minute at rest).  Cold hands and feet.  Not able to sweat in spite of heat and temperature.  Rapid breathing.  Blue lips.  Confusion and lethargy.  Difficulty being awakened.  Minimal urine production.  No tears. DIAGNOSIS  Your caregiver will diagnose dehydration based on your symptoms and your exam. Blood and urine tests will help confirm the diagnosis. The diagnostic evaluation should also identify the cause of dehydration. TREATMENT  Treatment of mild or moderate dehydration can often be done at home by increasing the amount of fluids that you drink. It is best to drink small amounts of fluid more often. Drinking too much at one time can make vomiting worse. Refer to the home care instructions below. Severe dehydration needs to be treated at the hospital where you will probably be given intravenous (IV) fluids that contain water and electrolytes. HOME CARE INSTRUCTIONS   Ask your caregiver about specific rehydration instructions.  Drink enough fluids to keep your urine clear or pale yellow.  Drink small amounts frequently if you have nausea and vomiting.  Eat as you normally do.  Avoid:  Foods or drinks high in sugar.  Carbonated  drinks.  Juice.  Extremely hot or cold fluids.  Drinks with caffeine.  Fatty, greasy foods.  Alcohol.  Tobacco.  Overeating.  Gelatin desserts.  Wash your hands well to avoid spreading bacteria and viruses.  Only take over-the-counter or prescription medicines for pain, discomfort, or fever as directed by your caregiver.  Ask your caregiver if you should continue all prescribed and over-the-counter medicines.  Keep all follow-up appointments with your caregiver. SEEK MEDICAL CARE IF:  You have abdominal pain and it increases or stays in one area (localizes).  You have a rash, stiff neck, or severe headache.  You are irritable, sleepy, or difficult to awaken.  You are weak, dizzy, or extremely thirsty. SEEK IMMEDIATE MEDICAL CARE IF:   You are unable to keep fluids down or you get worse despite treatment.  You have frequent episodes of vomiting or diarrhea.  You have blood or green matter (bile) in your vomit.  You have blood in your stool or your stool looks black and tarry.  You have not urinated in 6 to 8 hours, or you have only urinated a small amount of very dark urine.  You have a fever.  You faint. MAKE SURE YOU:   Understand these instructions.  Will watch your condition.  Will get help right away if you are not doing well or get worse. Document Released: 03/02/2005 Document Revised: 05/25/2011 Document Reviewed: 10/20/2010 Exit[employer] Patient Information 2014 [employer].  

## 2012-09-03 ENCOUNTER — Ambulatory Visit (HOSPITAL_BASED_OUTPATIENT_CLINIC_OR_DEPARTMENT_OTHER): Payer: Medicare Other

## 2012-09-03 VITALS — BP 95/61 | HR 99 | Temp 97.0°F | Resp 22

## 2012-09-03 DIAGNOSIS — E876 Hypokalemia: Secondary | ICD-10-CM

## 2012-09-03 DIAGNOSIS — C649 Malignant neoplasm of unspecified kidney, except renal pelvis: Secondary | ICD-10-CM

## 2012-09-03 MED ORDER — SODIUM CHLORIDE 0.9 % IV SOLN
Freq: Once | INTRAVENOUS | Status: AC
Start: 2012-09-03 — End: 2012-09-03
  Administered 2012-09-03: 10:00:00 via INTRAVENOUS

## 2012-09-03 MED ORDER — POTASSIUM CHLORIDE CRYS ER 20 MEQ PO TBCR
20.0000 meq | EXTENDED_RELEASE_TABLET | Freq: Once | ORAL | Status: AC
Start: 2012-09-03 — End: 2012-09-03
  Administered 2012-09-03: 20 meq via ORAL

## 2012-09-03 NOTE — Patient Instructions (Signed)
Dehydration, Adult Dehydration is when you lose more fluids from the body than you take in. Vital organs like the kidneys, brain, and heart cannot function without a proper amount of fluids and salt. Any loss of fluids from the body can cause dehydration.  CAUSES   Vomiting.  Diarrhea.  Excessive sweating.  Excessive urine output.  Fever. SYMPTOMS  Mild dehydration  Thirst.  Dry lips.  Slightly dry mouth. Moderate dehydration  Very dry mouth.  Sunken eyes.  Skin does not bounce back quickly when lightly pinched and released.  Dark urine and decreased urine production.  Decreased tear production.  Headache. Severe dehydration  Very dry mouth.  Extreme thirst.  Rapid, weak pulse (more than 100 beats per minute at rest).  Cold hands and feet.  Not able to sweat in spite of heat and temperature.  Rapid breathing.  Blue lips.  Confusion and lethargy.  Difficulty being awakened.  Minimal urine production.  No tears. DIAGNOSIS  Your caregiver will diagnose dehydration based on your symptoms and your exam. Blood and urine tests will help confirm the diagnosis. The diagnostic evaluation should also identify the cause of dehydration. TREATMENT  Treatment of mild or moderate dehydration can often be done at home by increasing the amount of fluids that you drink. It is best to drink small amounts of fluid more often. Drinking too much at one time can make vomiting worse. Refer to the home care instructions below. Severe dehydration needs to be treated at the hospital where you will probably be given intravenous (IV) fluids that contain water and electrolytes. HOME CARE INSTRUCTIONS   Ask your caregiver about specific rehydration instructions.  Drink enough fluids to keep your urine clear or pale yellow.  Drink small amounts frequently if you have nausea and vomiting.  Eat as you normally do.  Avoid:  Foods or drinks high in sugar.  Carbonated  drinks.  Juice.  Extremely hot or cold fluids.  Drinks with caffeine.  Fatty, greasy foods.  Alcohol.  Tobacco.  Overeating.  Gelatin desserts.  Wash your hands well to avoid spreading bacteria and viruses.  Only take over-the-counter or prescription medicines for pain, discomfort, or fever as directed by your caregiver.  Ask your caregiver if you should continue all prescribed and over-the-counter medicines.  Keep all follow-up appointments with your caregiver. SEEK MEDICAL CARE IF:  You have abdominal pain and it increases or stays in one area (localizes).  You have a rash, stiff neck, or severe headache.  You are irritable, sleepy, or difficult to awaken.  You are weak, dizzy, or extremely thirsty. SEEK IMMEDIATE MEDICAL CARE IF:   You are unable to keep fluids down or you get worse despite treatment.  You have frequent episodes of vomiting or diarrhea.  You have blood or green matter (bile) in your vomit.  You have blood in your stool or your stool looks black and tarry.  You have not urinated in 6 to 8 hours, or you have only urinated a small amount of very dark urine.  You have a fever.  You faint. MAKE SURE YOU:   Understand these instructions.  Will watch your condition.  Will get help right away if you are not doing well or get worse. Document Released: 03/02/2005 Document Revised: 05/25/2011 Document Reviewed: 10/20/2010 Exit[employer] Patient Information 2014 [employer].  

## 2012-09-04 NOTE — Progress Notes (Signed)
OFFICE PROGRESS NOTE  CC  CAMPBELL, [name], FNP [address] [place] [address] Dr. Dorothy Puffer Dr. 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  PRIOR THERAPY:  #1 in March 2014 patient presented to [facility] with dizziness slurred speech lower extremity edema and balance disturbances. She had CT that revealed brain mass and edema. She underwent a craniotomy on 06/07/2012 by Dr. Barnett Abu. The final pathology revealed renal cell carcinoma.  #2 subsequent staging studies including CT of the chest abdomen and pelvis revealed patient had a right renal cell mass along with some pulmonary metastasis and a left adrenal mass consistent with primary renal cell carcinoma.  #3 patient was seen by radiation oncology and she has now undergone SRS performed on 07/15/2012. Overall she tolerated it well without any problems.  CURRENT THERAPY: Begin systemic therapy with Sutent  INTERVAL HISTORY: Sue Ray 72 y.o. female returns for followup visit post discharge from the hospital. Overall she seems to be doing quite well without any problems. She denies any fevers chills night sweats headaches shortness of breath chest pains palpitations she is weak and tired. She has been gaining weight she has no back pain. She has no difficulty in sleeping no insomnia or depression. Remainder of the 10 point review of systems is negative.  MEDICAL HISTORY: Past Medical History  Diagnosis Date  . Macular degeneration   . Renal cell carcinoma   . Brain metastasis   . History of radiation therapy 07/15/2012    17 gray to right parietal, post-operative    ALLERGIES:  is allergic to sulfa antibiotics.  MEDICATIONS:  Current Outpatient Prescriptions  Medication Sig Dispense Refill  . levETIRAcetam (KEPPRA) 500 MG tablet Take 1 tablet (500 mg total) by mouth 2 (two) times daily.  60 tablet  2  . beta carotene w/minerals  (OCUVITE) tablet Take 1 tablet by mouth daily.      Marland Kitchen lisinopril (PRINIVIL,ZESTRIL) 10 MG tablet Take 1 tablet (10 mg total) by mouth daily.  90 tablet  1  . ondansetron (ZOFRAN) 8 MG tablet Take 1 tablet (8 mg total) by mouth every 8 (eight) hours as needed for nausea.  20 tablet  3  . Polyethyl Glycol-Propyl Glycol (SYSTANE) 0.4-0.3 % SOLN Apply 1 drop to eye.      . prochlorperazine (COMPAZINE) 10 MG tablet Take 1 tablet (10 mg total) by mouth every 6 (six) hours as needed.  30 tablet  3  . SUNItinib (SUTENT) 25 MG capsule Take one daily for 4 weeks then stop for 2 weeks  60 capsule  6   No current facility-administered medications for this visit.    SURGICAL HISTORY:  Past Surgical History  Procedure Laterality Date  . Craniotomy Right 06/07/2012    Procedure: CRANIOTOMY TUMOR EXCISION;  Surgeon: Barnett Abu, MD;  Location: [facility];  Service: Neurosurgery;  Laterality: Right;  Right Parietal Craniotomy for Tumor with Stealth    REVIEW OF SYSTEMS:  Pertinent items are noted in HPI.   HEALTH MAINTENANCE:   PHYSICAL EXAMINATION: Blood pressure 120/69, pulse 80, temperature 98.4 F (36.9 C), temperature source Oral, resp. rate 20, height 4\' 10"  (1.473 m), weight 133 lb 14.4 oz (60.737 kg). Body mass index is 27.99 kg/(m^2). ECOG PERFORMANCE STATUS: 0 - Asymptomatic  Well-developed well-nourished female in no acute distress HEENT exam EOMI PERRLA sclerae anicteric no conjunctival pallor oral mucosa is moist neck is supple lungs clear cardiovascular regular rate rhythm abdomen  is soft protuberant there is fullness in the right renal area. Extremities no edema neuro patient's alert oriented no neuro focal deficits.   LABORATORY DATA: Lab Results  Component Value Date   WBC 3.9* 09/01/2012   HGB 15.6* 09/01/2012   HCT 45.0 09/01/2012   MCV 86.7 09/01/2012   PLT 106* 09/01/2012      Chemistry      Component Value Date/Time   NA 129* 09/01/2012 1719   NA 140 08/26/2012 1124   K  3.1* 09/01/2012 1719   K 4.2 08/26/2012 1124   CL 92* 09/01/2012 1719   CL 103 08/26/2012 1124   CO2 24 09/01/2012 1719   CO2 27 08/26/2012 1124   BUN 10 09/01/2012 1719   BUN 10.0 08/26/2012 1124   CREATININE 0.69 09/01/2012 1719   CREATININE 0.8 08/26/2012 1124      Component Value Date/Time   CALCIUM 9.1 09/01/2012 1719   CALCIUM 9.7 08/26/2012 1124   ALKPHOS 80 09/01/2012 1719   ALKPHOS 87 08/26/2012 1124   AST 35 09/01/2012 1719   AST 21 08/26/2012 1124   ALT 25 09/01/2012 1719   ALT 19 08/26/2012 1124   BILITOT 0.4 09/01/2012 1719   BILITOT 0.35 08/26/2012 1124       RADIOGRAPHIC STUDIES:  Mr Laqueta Jean WU Contrast  07/12/2012  *RADIOLOGY REPORT*  Clinical Data: 72 year old female with metastatic renal cell carcinoma.  Right hemisphere brain metastasis status post craniotomy and resection on 06/07/2012.  Stereotactic radiosurgery targeting requested.  MRI HEAD WITHOUT AND WITH CONTRAST  Technique:  Multiplanar, multiecho pulse sequences of the brain and surrounding structures were obtained according to standard protocol without and with intravenous contrast  Contrast: 12mL MULTIHANCE GADOBENATE DIMEGLUMINE 529 MG/ML IV SOLN  Comparison: Preoperative stereotactic CT head with contrast 06/06/2012.  Preoperative brain MRI 06/05/2012.  Findings: Cystic resection site in the right parietal lobe with a small volume circumferential hemosiderin/blood products. Mildly nodular restricted diffusion along the inferomedial resection cavity (series 400 image 24).  Small nodular foci of enhancement along the resection cavity.  See series 10 images 90 - 104, as well as postcontrast sagittal and coronal images.   Overlying craniotomy with a smooth dural thickening and enhancement which is likely postoperative.  Postoperatively, right hemisphere cerebral edema has nearly resolved with a small area of local continued T2 and FLAIR hyperintensity surrounding resection cavity.  Mass effect and midline shift has resolved.  No  other abnormal intracranial enhancement is identified.  No ventriculomegaly. Major intracranial vascular flow voids are stable.  Stable mild additional cerebral white matter nonspecific T2 and FLAIR hyperintensity.  Negative pituitary, cervicomedullary junction and visualized cervical spine.  Visualized bone marrow signal is normal.  Postoperative changes to the globes. Visualized paranasal sinuses and mastoids are clear.  No acute scalp soft tissue findings.  IMPRESSION: 1.  Status post resection of right parietal metastasis.  Mild peripheral nodular enhancement at the cystic resection cavity.  See series 10, series 11 and series 12. And a small focus of restricted diffusion on series 400 image 24 might reflect residual hypercellularity. 2.  No other brain metastasis identified. 3.  Interval resolved intracranial mass effect.  Markedly diminished edema in the right parietal lobe.   Original Report Authenticated By: Erskine Speed, M.D.     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 Patient is status post craniotomy on 06/07/2012 by Dr. Danielle Dess. The final pathology revealed renal cell carcinoma. Postoperatively she has done well.  She  #2 patient is also status post SRS by Dr. Dorothy Puffer on 07/15/2012. Overall she tolerated it well and is without any problems.  #3 patient is now recommended systemic palliative treatments with targeted therapies such as Sutent. We discussed rationale of palliative treatment for his renal cell carcinoma. They do understand that there is no cure for this however we certainly could continue to treat her as long as she tolerates treatment and there is no progressive disease. Plan is to begin the Sutent at a medium dose of 25 mg 4 weeks on 2 weeks off. We discussed side effects benefits and rationale.  #4 patient had staging CT scan performed which essentially shows stable disease in the kidney.  #5 the patient has not been able  to obtain the Sutent for now because of the cost. She is recommended to fill out paperwork so that we can get the drug for her through assistance. As soon as she obtains this she is recommended to begin the Sutent.   PLAN:   #1 patient will obtain Sutent and begin this at 25 mg daily 4 weeks on 2 weeks of.  #2 I will see her back in about 2-3 weeks' time for followup.  All questions were answered. The patient knows to call the clinic with any problems, questions or concerns. We can certainly see the patient much sooner if necessary.  I spent 25 minutes counseling the patient face to face. The total time spent in the appointment was 30 minutes.    Drue Second, MD Medical/Oncology [facility] [phone] (beeper) [phone] (Office)

## 2012-09-05 ENCOUNTER — Telehealth: Payer: Self-pay | Admitting: Oncology

## 2012-09-05 ENCOUNTER — Telehealth: Payer: Self-pay | Admitting: Family

## 2012-09-05 NOTE — Progress Notes (Signed)
Sue Ray is better tody. I do think that her fever diarrhea and n/v may be due to the sutent. I have recommended holding it for now. She will proceed with IVF todays, on Saturday and then she will see me back on Tuesday of next in follow up to determine whether or not she should pursue therapy with sutent or whether we will modify her regimen.  Drue Second, MD Medical/Oncology [facility] [phone] (beeper) [phone] (Office)

## 2012-09-05 NOTE — Telephone Encounter (Signed)
Pt states that she still has some edema in both legs but has an appointment with Dr. Welton Flakes tomorrow

## 2012-09-05 NOTE — Telephone Encounter (Signed)
Please call and see how patient is doing

## 2012-09-06 ENCOUNTER — Ambulatory Visit (HOSPITAL_BASED_OUTPATIENT_CLINIC_OR_DEPARTMENT_OTHER): Payer: Medicare Other | Admitting: Oncology

## 2012-09-06 ENCOUNTER — Ambulatory Visit (HOSPITAL_BASED_OUTPATIENT_CLINIC_OR_DEPARTMENT_OTHER): Payer: Medicare Other

## 2012-09-06 ENCOUNTER — Ambulatory Visit (HOSPITAL_BASED_OUTPATIENT_CLINIC_OR_DEPARTMENT_OTHER): Payer: Medicare Other | Admitting: Lab

## 2012-09-06 ENCOUNTER — Other Ambulatory Visit (HOSPITAL_COMMUNITY)
Admission: RE | Admit: 2012-09-06 | Discharge: 2012-09-06 | Disposition: A | Discharge status: Home / Self Care | Payer: Medicare Other | Source: Ambulatory Visit | Attending: Oncology | Admitting: Oncology

## 2012-09-06 ENCOUNTER — Other Ambulatory Visit (HOSPITAL_BASED_OUTPATIENT_CLINIC_OR_DEPARTMENT_OTHER): Payer: Medicare Other | Admitting: Lab

## 2012-09-06 ENCOUNTER — Ambulatory Visit (HOSPITAL_COMMUNITY)
Admission: RE | Admit: 2012-09-06 | Discharge: 2012-09-06 | Disposition: A | Discharge status: Home / Self Care | Payer: Medicare Other | Source: Ambulatory Visit | Attending: Oncology | Admitting: Oncology

## 2012-09-06 ENCOUNTER — Telehealth: Payer: Self-pay | Admitting: *Deleted

## 2012-09-06 ENCOUNTER — Other Ambulatory Visit: Payer: Medicare Other | Admitting: Lab

## 2012-09-06 VITALS — BP 117/67 | HR 98 | Temp 97.7°F | Resp 20 | Ht <= 58 in | Wt 141.4 lb

## 2012-09-06 DIAGNOSIS — M899 Disorder of bone, unspecified: Secondary | ICD-10-CM | POA: Insufficient documentation

## 2012-09-06 DIAGNOSIS — N390 Urinary tract infection, site not specified: Secondary | ICD-10-CM

## 2012-09-06 DIAGNOSIS — C7931 Secondary malignant neoplasm of brain: Secondary | ICD-10-CM

## 2012-09-06 DIAGNOSIS — J811 Chronic pulmonary edema: Secondary | ICD-10-CM

## 2012-09-06 DIAGNOSIS — R0602 Shortness of breath: Secondary | ICD-10-CM

## 2012-09-06 DIAGNOSIS — M8448XA Pathological fracture, other site, initial encounter for fracture: Secondary | ICD-10-CM | POA: Insufficient documentation

## 2012-09-06 DIAGNOSIS — E860 Dehydration: Secondary | ICD-10-CM

## 2012-09-06 DIAGNOSIS — R918 Other nonspecific abnormal finding of lung field: Secondary | ICD-10-CM | POA: Insufficient documentation

## 2012-09-06 DIAGNOSIS — C649 Malignant neoplasm of unspecified kidney, except renal pelvis: Secondary | ICD-10-CM

## 2012-09-06 DIAGNOSIS — N2889 Other specified disorders of kidney and ureter: Secondary | ICD-10-CM

## 2012-09-06 DIAGNOSIS — D709 Neutropenia, unspecified: Secondary | ICD-10-CM

## 2012-09-06 DIAGNOSIS — R319 Hematuria, unspecified: Secondary | ICD-10-CM | POA: Insufficient documentation

## 2012-09-06 LAB — CBC WITH DIFFERENTIAL/PLATELET
BASO%: 0.4 % (ref 0.0–2.0)
EOS%: 10.9 % — ABNORMAL HIGH (ref 0.0–7.0)
HCT: 38.2 % (ref 34.8–46.6)
LYMPH%: 27.6 % (ref 14.0–49.7)
MCH: 29.4 pg (ref 25.1–34.0)
MCHC: 33.5 g/dL (ref 31.5–36.0)
MONO#: 0.2 10*3/uL (ref 0.1–0.9)
NEUT%: 53.3 % (ref 38.4–76.8)
Platelets: 74 10*3/uL — ABNORMAL LOW (ref 145–400)
RBC: 4.35 10*6/uL (ref 3.70–5.45)
WBC: 2.6 10*3/uL — ABNORMAL LOW (ref 3.9–10.3)
lymph#: 0.7 10*3/uL — ABNORMAL LOW (ref 0.9–3.3)
nRBC: 0 % (ref 0–0)

## 2012-09-06 LAB — BASIC METABOLIC PANEL (CC13)
BUN: 9.2 mg/dL (ref 7.0–26.0)
Creatinine: 0.6 mg/dL (ref 0.6–1.1)

## 2012-09-06 LAB — URINALYSIS, MICROSCOPIC - CHCC
Bilirubin (Urine): NEGATIVE
Glucose: NEGATIVE mg/dL
Ketones: NEGATIVE mg/dL
Leukocyte Esterase: NEGATIVE
Nitrite: NEGATIVE

## 2012-09-06 MED ORDER — FUROSEMIDE 20 MG PO TABS: 20.0000 mg | ORAL_TABLET | Freq: Every day | ORAL | Status: DC

## 2012-09-06 MED ORDER — CIPROFLOXACIN HCL 500 MG PO TABS: 500.0000 mg | ORAL_TABLET | Freq: Two times a day (BID) | ORAL | Status: DC

## 2012-09-06 MED ORDER — SODIUM CHLORIDE 0.9 % IV SOLN
Freq: Once | INTRAVENOUS | Status: AC
Start: 2012-09-06 — End: 2012-09-06
  Administered 2012-09-06: 12:00:00 via INTRAVENOUS

## 2012-09-06 MED ORDER — POTASSIUM CHLORIDE ER 10 MEQ PO CPCR: 20.0000 meq | ORAL_CAPSULE | Freq: Every day | ORAL | Status: DC

## 2012-09-06 NOTE — Telephone Encounter (Signed)
Sue Ray called reporting pt. is s/p cxr, is wheezing, came over in wheelchair and says she's never had to use a wheelchair before.  Saw Dr. Welton Flakes earlier today and is coming to see Dr. Welton Flakes to get help with her breathing.  Informed her to send pt. To ER and says no she and her daughter want to come to Dr. Welton Flakes.  Met patient in [facility] lobby with her daughter having casual conversation.  No wheezing or labored breathing noted initially.  Symptoms progressed with speech.  Reports labored breathing started about 10 days ago.  CXR report read STAT, notified Mardella Layman and received instructions for patient to begin lasix and potassium for pulmonary edema.  Reviewed with her how to take lasix and she says she already has the high potassium food list.

## 2012-09-06 NOTE — Patient Instructions (Addendum)
Dehydration, Adult Dehydration is when you lose more fluids from the body than you take in. Vital organs like the kidneys, brain, and heart cannot function without a proper amount of fluids and salt. Any loss of fluids from the body can cause dehydration.  CAUSES   Vomiting.  Diarrhea.  Excessive sweating.  Excessive urine output.  Fever. SYMPTOMS  Mild dehydration  Thirst.  Dry lips.  Slightly dry mouth. Moderate dehydration  Very dry mouth.  Sunken eyes.  Skin does not bounce back quickly when lightly pinched and released.  Dark urine and decreased urine production.  Decreased tear production.  Headache. Severe dehydration  Very dry mouth.  Extreme thirst.  Rapid, weak pulse (more than 100 beats per minute at rest).  Cold hands and feet.  Not able to sweat in spite of heat and temperature.  Rapid breathing.  Blue lips.  Confusion and lethargy.  Difficulty being awakened.  Minimal urine production.  No tears. DIAGNOSIS  Your caregiver will diagnose dehydration based on your symptoms and your exam. Blood and urine tests will help confirm the diagnosis. The diagnostic evaluation should also identify the cause of dehydration. TREATMENT  Treatment of mild or moderate dehydration can often be done at home by increasing the amount of fluids that you drink. It is best to drink small amounts of fluid more often. Drinking too much at one time can make vomiting worse. Refer to the home care instructions below. Severe dehydration needs to be treated at the hospital where you will probably be given intravenous (IV) fluids that contain water and electrolytes. HOME CARE INSTRUCTIONS   Ask your caregiver about specific rehydration instructions.  Drink enough fluids to keep your urine clear or pale yellow.  Drink small amounts frequently if you have nausea and vomiting.  Eat as you normally do.  Avoid:  Foods or drinks high in sugar.  Carbonated  drinks.  Juice.  Extremely hot or cold fluids.  Drinks with caffeine.  Fatty, greasy foods.  Alcohol.  Tobacco.  Overeating.  Gelatin desserts.  Wash your hands well to avoid spreading bacteria and viruses.  Only take over-the-counter or prescription medicines for pain, discomfort, or fever as directed by your caregiver.  Ask your caregiver if you should continue all prescribed and over-the-counter medicines.  Keep all follow-up appointments with your caregiver. SEEK MEDICAL CARE IF:  You have abdominal pain and it increases or stays in one area (localizes).  You have a rash, stiff neck, or severe headache.  You are irritable, sleepy, or difficult to awaken.  You are weak, dizzy, or extremely thirsty. SEEK IMMEDIATE MEDICAL CARE IF:   You are unable to keep fluids down or you get worse despite treatment.  You have frequent episodes of vomiting or diarrhea.  You have blood or green matter (bile) in your vomit.  You have blood in your stool or your stool looks black and tarry.  You have not urinated in 6 to 8 hours, or you have only urinated a small amount of very dark urine.  You have a fever.  You faint. MAKE SURE YOU:   Understand these instructions.  Will watch your condition.  Will get help right away if you are not doing well or get worse. Document Released: 03/02/2005 Document Revised: 05/25/2011 Document Reviewed: 10/20/2010 Exit[employer] Patient Information 2014 [employer].  

## 2012-09-06 NOTE — Telephone Encounter (Signed)
appts made and printed. Pt is aware to go hve a cxr. She is also aware that cs will call her w/ appt d/t for ct. gv appt d/t for DR. Grapey for 10/07/12@11 :15am....td

## 2012-09-06 NOTE — Patient Instructions (Addendum)
#  1 I have referred you to Dr. Isabel Caprice for urologic evaluation.  #2 borders CT of the abdomen and pelvis as well as a chest x-ray to evaluate for any evidence of progressive disease or infections.  #3 he will proceed with receiving IV fluids today.  #3 fevers of unclear etiology I will put you on empiric antibiotics consisting of Cipro twice a day. Your fevers may be due to you being neutropenic because of the chemotherapy he received.  #4 if you continue to develop fevers and chills please report to the nearest emergency room.

## 2012-09-07 LAB — CULTURE, BLOOD (ROUTINE X 2): Culture: NO GROWTH

## 2012-09-07 NOTE — Progress Notes (Signed)
Quick Note:  Call patient: ask patient to begin azithromycin 250 mg   Take 2 today and 1 daily x 4 days (dispense 1 Z-pak) ______

## 2012-09-08 ENCOUNTER — Telehealth: Payer: Self-pay | Admitting: Medical Oncology

## 2012-09-08 ENCOUNTER — Ambulatory Visit (HOSPITAL_COMMUNITY)
Admission: RE | Admit: 2012-09-08 | Discharge: 2012-09-08 | Disposition: A | Discharge status: Home / Self Care | Payer: Medicare Other | Source: Ambulatory Visit | Attending: Oncology | Admitting: Oncology

## 2012-09-08 DIAGNOSIS — C7970 Secondary malignant neoplasm of unspecified adrenal gland: Secondary | ICD-10-CM | POA: Insufficient documentation

## 2012-09-08 DIAGNOSIS — R918 Other nonspecific abnormal finding of lung field: Secondary | ICD-10-CM | POA: Insufficient documentation

## 2012-09-08 DIAGNOSIS — IMO0002 Reserved for concepts with insufficient information to code with codable children: Secondary | ICD-10-CM | POA: Insufficient documentation

## 2012-09-08 DIAGNOSIS — C649 Malignant neoplasm of unspecified kidney, except renal pelvis: Secondary | ICD-10-CM | POA: Insufficient documentation

## 2012-09-08 DIAGNOSIS — K7689 Other specified diseases of liver: Secondary | ICD-10-CM | POA: Insufficient documentation

## 2012-09-08 MED ORDER — AZITHROMYCIN 250 MG PO TABS: ORAL_TABLET | ORAL | Status: DC

## 2012-09-08 MED ORDER — IOHEXOL 300 MG/ML  SOLN
100.0000 mL | Freq: Once | INTRAMUSCULAR | Status: AC | PRN
  Administered 2012-09-08: 100 mL via INTRAVENOUS

## 2012-09-08 NOTE — Telephone Encounter (Signed)
Message copied by Rexene Edison on Thu Sep 08, 2012 11:56 AM ------      Message from: Victorino December      Created: Wed Sep 07, 2012  5:19 PM       Call patient: ask patient to begin azithromycin 250 mg             Take 2 today and 1 daily x 4 days (dispense 1 Z-pak) ------

## 2012-09-08 NOTE — Telephone Encounter (Signed)
Per MD, called patient to inform her that Dr Welton Flakes would like for her to begin azithromycin 250mg  by mouth. Patient asleep, spoke with daughter and informed daughter prescription for azithromycin 250mg  (zpack) patient to take 2 today and 1 daily for 4 days. Daughter verbalized understanding. Patient/daughter to call back with any questions or conerns.  Prescription escribed to pt's listed pharmacy.

## 2012-09-09 ENCOUNTER — Telehealth: Payer: Self-pay | Admitting: Family

## 2012-09-09 MED ORDER — LEVETIRACETAM 500 MG PO TABS: 500.0000 mg | ORAL_TABLET | Freq: Two times a day (BID) | ORAL | Status: DC

## 2012-09-09 NOTE — Telephone Encounter (Signed)
PT daughter called and stated that her mother was out of her levETIRAcetam (KEPPRA) 500 MG tablet. She stated that they have spent 5 out of the last 7 days in the hospital, and this is all new to her. She was unaware that she needed to give 3 days notice, and will be sure to do so next time. She would like this called into the [facility] on [address], please assist.

## 2012-09-09 NOTE — Telephone Encounter (Signed)
sent 

## 2012-09-09 NOTE — Telephone Encounter (Signed)
You have not prescribed previously. Ok to fill??

## 2012-09-12 ENCOUNTER — Ambulatory Visit (INDEPENDENT_AMBULATORY_CARE_PROVIDER_SITE_OTHER): Payer: Medicare Other | Admitting: Family

## 2012-09-12 ENCOUNTER — Encounter: Payer: Self-pay | Admitting: Family

## 2012-09-12 ENCOUNTER — Encounter: Payer: Self-pay | Admitting: Oncology

## 2012-09-12 VITALS — BP 102/60 | HR 88 | Wt 139.0 lb

## 2012-09-12 DIAGNOSIS — R630 Anorexia: Secondary | ICD-10-CM

## 2012-09-12 DIAGNOSIS — C641 Malignant neoplasm of right kidney, except renal pelvis: Secondary | ICD-10-CM

## 2012-09-12 DIAGNOSIS — C649 Malignant neoplasm of unspecified kidney, except renal pelvis: Secondary | ICD-10-CM

## 2012-09-12 DIAGNOSIS — J811 Chronic pulmonary edema: Secondary | ICD-10-CM

## 2012-09-12 MED ORDER — FUROSEMIDE 20 MG PO TABS: 40.0000 mg | ORAL_TABLET | Freq: Every day | ORAL | Status: DC

## 2012-09-12 MED ORDER — HYDROCODONE-ACETAMINOPHEN 10-325 MG PO TABS: 1.0000 | ORAL_TABLET | Freq: Four times a day (QID) | ORAL | Status: DC | PRN

## 2012-09-12 MED ORDER — MIRTAZAPINE 7.5 MG PO TABS: 7.5000 mg | ORAL_TABLET | Freq: Every day | ORAL | Status: DC

## 2012-09-12 NOTE — Progress Notes (Signed)
Subjective:    Patient ID: Sue Ray, female    DOB: Feb 25, 1941, 72 y.o.   MRN: [id number]  HPI 72 year old white female, nonsmoker, is in today for a recheck. She has a history of Hypertension, Right Renal Cell Carcinoma with mets to the brain and spinal cord. She underwent 18 rounds of chemotherapy that showed some reduction in the cancer mass. However, on June 19th she presented to the ED with fever and sinus pressure. She was treated with antibiotics and is better today. She has not undergone any more chemotherapy since that point. She has concerns today of myalgias in her right arm and leg has been ongoing since being on chemotherapy. She currently has Norco 05/325 to have been helping her pain in the past but is not keeping her pain under control at this point. She is scheduled to see an oncologist Dr. Tyrell Antonio at [facility] on Monday to discuss further treatment options. Also has concerns of decreased appetite and not sleeping well at night. She believes this is a side effect of the chemotherapy as well. No previous issues prior to therapy. Overall, she feels like she is very upbeat and tries to keep a positive attitude. No feelings of helplessness or hopelessness.  Patient also reports increased shortness of breath. She had a chest x-ray that showed possible pneumonia versus pulmonary edema and has been treated with a Z-Pak and tolerated it well. However she continues to be short of breath and has peripheral edema. Denies any chest pain.  Review of Systems  Constitutional: Negative.   HENT: Negative.   Respiratory: Negative.   Cardiovascular: Negative.   Gastrointestinal: Negative.   Endocrine: Negative.   Genitourinary: Negative.   Musculoskeletal: Negative.   Skin: Negative.   Allergic/Immunologic: Negative.   Neurological: Negative.   Hematological: Negative.   Psychiatric/Behavioral: Negative.    Past Medical History  Diagnosis Date  . Macular degeneration   . Renal cell  carcinoma   . Brain metastasis   . History of radiation therapy 07/15/2012    17 gray to right parietal, post-operative    History   Social History  . Marital Status: Married    Spouse Name: N/A    Number of Children: N/A  . Years of Education: N/A   Occupational History  . Not on file.   Social History Main Topics  . Smoking status: Former Smoker    Types: Cigarettes    Quit date: 01/15/2012  . Smokeless tobacco: Never Used  . Alcohol Use: Yes     Comment: Rare  . Drug Use: No  . Sexually Active: No     Comment: electronic cigarette   Other Topics Concern  . Not on file   Social History Narrative  . No narrative on file    Past Surgical History  Procedure Laterality Date  . Craniotomy Right 06/07/2012    Procedure: CRANIOTOMY TUMOR EXCISION;  Surgeon: Barnett Abu, MD;  Location: [facility];  Service: Neurosurgery;  Laterality: Right;  Right Parietal Craniotomy for Tumor with Stealth    Family History  Problem Relation Age of Onset  . Brain cancer Neg Hx   . Hypertension Brother   . Hyperlipidemia Brother   . Parkinson's disease Brother   . Cancer Brother     Lung cancer  . Heart attack Brother     Allergies  Allergen Reactions  . Sulfa Antibiotics Swelling    nervous    Current Outpatient Prescriptions on File Prior to Visit  Medication Sig Dispense Refill  . ciprofloxacin (CIPRO) 500 MG tablet Take 1 tablet (500 mg total) by mouth 2 (two) times daily.  14 tablet  0  . levETIRAcetam (KEPPRA) 500 MG tablet Take 1 tablet (500 mg total) by mouth 2 (two) times daily.  60 tablet  2  . lisinopril (PRINIVIL,ZESTRIL) 10 MG tablet Take 1 tablet (10 mg total) by mouth daily.  90 tablet  1  . ondansetron (ZOFRAN) 8 MG tablet Take 1 tablet (8 mg total) by mouth every 8 (eight) hours as needed for nausea.  20 tablet  3  . Polyethyl Glycol-Propyl Glycol (SYSTANE) 0.4-0.3 % SOLN Apply 1 drop to eye.      . potassium chloride (MICRO-K) 10 MEQ CR capsule Take 2  capsules (20 mEq total) by mouth daily.  60 capsule  0  . prochlorperazine (COMPAZINE) 10 MG tablet Take 1 tablet (10 mg total) by mouth every 6 (six) hours as needed.  30 tablet  3  . azithromycin (ZITHROMAX Z-PAK) 250 MG tablet Take 2 tablets today and then 1 tablet daily for 4 days.  6 each  0  . beta carotene w/minerals (OCUVITE) tablet Take 1 tablet by mouth daily.       No current facility-administered medications on file prior to visit.    BP 102/60  Pulse 88  Wt 139 lb (63.05 kg)  BMI 29.06 kg/m2  SpO2 98%chart    Objective:   Physical Exam  Constitutional: She is oriented to person, place, and time. She appears well-developed and well-nourished.  HENT:  Right Ear: External ear normal.  Left Ear: External ear normal.  Nose: Nose normal.  Mouth/Throat: Oropharynx is clear and moist.  Neck: Normal range of motion. Neck supple. No thyromegaly present.  Cardiovascular: Normal rate, regular rhythm and normal heart sounds.   Pulmonary/Chest: Breath sounds normal.  Abdominal: Soft. Bowel sounds are normal.  Musculoskeletal: Normal range of motion.  Neurological: She is alert and oriented to person, place, and time.  Skin: Skin is warm and dry.  Psychiatric: She has a normal mood and affect.          Assessment & Plan:  Assessment: 1.Renal Cell Carcinoma with mets to brain 2. Hypertension 3. Myalgia 4. Decreased appetite 5. Insomnia 6. Peripheral edema-likely related to fluid overload  Plan: Increase Hydrocodone to 10/325mg  four times a day. Recheck in 2 weeks to be sure she has adequate pain control. Start Remeron 7.5 mg once daily. We'll consider increasing to 15 mg at next office visit. Hopefully this will help her appetite, insomnia, and mood overall. Increase Lasix to 40 mg daily. Lab sent. Recheck 2-3 weeks.

## 2012-09-12 NOTE — Patient Instructions (Signed)
Pulmonary Edema Pulmonary edema (PE) is a condition in which fluid collects in the lungs. This makes it hard to breathe. PE may be a result of the heart not pumping very well, or a result of injury.  CAUSES   Coronary artery disease (blockages in the arteries of the heart) deprives the heart muscle of oxygen, and weakens the muscle. A heart attack is a form of coronary artery disease.  High blood pressure causes the heart muscle to work harder than usual. Over time, the heart muscle may get stiff, and it starts to work less efficiently. It may also fatigue and weaken.  Viral infection of the heart (myocarditis) may weaken the heart muscle.  Metabolic conditions such as thyroid disease, excessive alcohol use, certain vitamin deficiencies, or diabetes, may also weaken the heart muscle.  Leaky or stiff heart valves may impair normal heart function.  Lung disease may strain the heart muscle.  Excessive demands on the heart such as too much salt or fluid intake. Failure to take prescribed medicines.  Lung injury from toxins such as heat or poisonous gas.  Infection in the lungs or other parts of the body.  Fluid overload caused by kidney failure or medications. SYMPTOMS   Shortness of breath at rest or with exertion.  Grunting, wheezing or gurgling while breathing.  Feeling like you are cannot get enough air.  Breaths are shallow and fast.  A lot of coughing with frothy or bloody mucus.  Skin may become cool, damp and turn a pale or bluish color. DIAGNOSIS  Initial diagnosis may be based on your history, symptoms, and a physical examination. Additional tests for PE may include:  EKG.  Chest X-ray.  Blood tests.  Stress test.  Ultrasound evaluation of the heart (echocardiogram).  Evaluation by a heart doctor (cardiologist).  Test of the heart arteries to look for blockages (angiogram).  Check of blood oxygen. TREATMENT  Treatment of PE will depend on the underlying  cause and will focus first on relieving the symptoms.  Extra oxygen to make breathing easier and assist with removing mucus. This may include breathing treatments or a tube into the lungs and a breathing machine.  Medicine to help the body get rid of extra water, usually through an IV.  IV drugs or pills to help the heart pump better.  If poor heart function is the cause, it may include:  Procedures to open blocked arteries, repair damaged heart valves, or remove some of the damaged heart muscle.  A pacemaker to help the heart pump with less effort. HOME CARE INSTRUCTIONS   Activity Level -- Your caregiver will help you determine what type of exercise program may be helpful. It is important to maintain strength and increase it if possible. Pace your activities to avoid shortness of breath or chest pain. Rest for at least 1 hour before and after meals. Cardiac rehabilitation programs are available in some locations.  Diet -- Eat a heart healthy diet, low in salt, saturated fat, and cholesterol. Ask for help with choices.  Weight Monitoring --Record your hospital or clinic weight. When you get home, compare it to your scale and record your weight. Then, weigh yourself first thing in the morning daily, and record the weights. Tell your caregiver right away if you have gained 3 lb/1.4 kg in 1 day, 5 lb/2.3 kg in a week, or whatever amount you were told to report. Your medications may need to be adjusted.  Blood pressure monitoring should be done as  often as directed. You can get a home blood pressure cuff at your drugstore. Record these values and bring them with you for your clinic visits. Notify your caregiver if you become dizzy or lightheaded upon standing up.  If you are currently a smoker, it is time to quit. Nicotine makes your heart work harder and is one of the leading causes of heart (cardiac) deaths. Do not use nicotine gum or patches before talking to your doctor.  Make an  appointment with your caregiver as directed for follow-up. SEEK IMMEDIATE MEDICAL CARE IF:   You have severe chest pain. Especially if the pain is crushing or pressure-like and spreads to the arms, back, neck, or jaw, or if you have sweating, are feeling sick to your stomach (nausea), or you are experiencing shortness of breath. THIS IS AN EMERGENCY. DO NOT wait to see if the pain will go away. Get medical help at once. Call for local emergency medical help. DO NOT drive yourself to the hospital.  Your weight increases by 3 lb/1.4 kg in 1 day or 5 lb/2.3 kg in a week.  You notice increasing shortness of breath that is unusual for you. This may happen during rest, sleep or with activity.  You develop chest pain (angina) or pain that is unusual for you.  You develop sweating or nausea that is unusual for you.  You notice more swelling in your hands, feet, ankles or abdomen.  You notice lasting (persistent) dizziness, blurred vision, headache, or unsteadiness.  You begin to cough up bloody mucus (sputum).  You are unable to sleep because it is hard to breathe.  You begin to feel a "jumping" or "fluttering" sensation (palpitations) in the chest that is unusual for you. IMPORTANT:  Make a list of every medicine, vitamin, or herbal supplement you are taking. Keep the list with you at all times. Show it to your caregiver at every visit and before starting a new medicine. Keep the list up to date.  Ask your caregiver or pharmacist to help you write a plan or schedule so that you know things about each medicine such as:  Why you are taking it.  The possible side effects.  The best time of day to take it.  Foods to take with it or avoid.  When to stop taking it.  Ask your caregiver for a copy of your latest heart tracing (EKG or ECG). Keep a copy with you at all times. Document Released: 05/23/2002 Document Revised: 05/25/2011 Document Reviewed: 04/29/2007 [facility] Patient Information  2014 [place], [place].

## 2012-09-12 NOTE — Progress Notes (Signed)
Per [employer] the patient was approved for Sutent thru 03/15/13 and is being shipped to her.I will send to medical records.

## 2012-09-13 ENCOUNTER — Ambulatory Visit: Payer: Medicare Other | Admitting: Adult Health

## 2012-09-13 ENCOUNTER — Telehealth: Payer: Self-pay | Admitting: Oncology

## 2012-09-13 ENCOUNTER — Other Ambulatory Visit: Payer: Medicare Other

## 2012-09-13 ENCOUNTER — Telehealth: Payer: Self-pay | Admitting: Family

## 2012-09-13 ENCOUNTER — Other Ambulatory Visit: Payer: Self-pay | Admitting: Emergency Medicine

## 2012-09-13 DIAGNOSIS — C641 Malignant neoplasm of right kidney, except renal pelvis: Secondary | ICD-10-CM

## 2012-09-13 NOTE — Telephone Encounter (Signed)
Patient Information:  Caller Name: Sue Ray  Phone: [phone]  Patient: Sue Ray, Sue Ray  Gender: Female  DOB: [birth year]  Age: 72 Years  PCP: Adline M[facility])  Office Follow Up:  Does the office need to follow up with this patient?: Yes  Instructions For The Office: Please review, contact family with direction for care.  Would prefer not to see usual concologist today as scheduled at 1:15 pm.  RN Note:  Note - has been treated by oncologist and has Appt there today 1:15 pm but is changing to another oncology practice at [facility] and has first Appt there 09/19/2012.  Would get direction for care other than with current oncologist. Asking for call back to [phone].  If no answer can reach son, Sue Ray at cell [phone]  Symptoms  Reason For Call & Symptoms: Has Stage 4 Renal Cancer.  Has been on Sulent - last Tx was 6/19 and no more treatments scheduled at this time.  Had not been taking narcotics for pain until 6/27 when she started Hydrocodone 5mg .  Pain increasing in intensity so seen in office yesterday 6/30 and Hydrocodone changed to 10 mg.  Pain has been in upper back between shoulder blades and radiating down Right arm and Right leg.  Pain increasing by evening - pain also now radiating into Left arm, took  one dose at 7:30 pm then again at 7:15 am today.  Felt it took too long for med to start working and felt not enough pain relief.  Currently asleep.  Asking if should take her to ER to be admitted.  Reviewed Health History In EMR: Yes  Reviewed Medications In EMR: Yes  Reviewed Allergies In EMR: Yes  Reviewed Surgeries / Procedures: Yes  Date of Onset of Symptoms: 09/06/2012  Treatments Tried: hydrocodone    too slow of pain relief 10/12/2012  Treatments Tried Worked: No  Guideline(s) Used:  Back Pain  Disposition Per Guideline:   See Today in Office  Reason For Disposition Reached:   High-risk adult (e.g., history of cancer, history of HIV, or history  of IV drug abuse)  Advice Given:  N/A  Patient Will Follow Care Advice:  YES

## 2012-09-13 NOTE — Telephone Encounter (Signed)
Per Oran Rein, pt should go to oncology appointment today at 1:15pm  Pt's son, Sidonie Dickens, advised of Padonda's message and questions why, stating that they already know what the oncologist will say which is, "I don't know." I advised him if they are more comfortable and feel like pt needs to go to the ED then that is their decision to make, what Oran Rein says is merely a suggestion. Son asks if Oran Rein will be able to help out with pain medication and he was advised that hydrocodone was increased to HYDROcodone-acetaminophen (NORCO) 10-325 MG per tablet just yesterday. Son states that he will pass along the information and they will make a decision from there.

## 2012-09-20 ENCOUNTER — Telehealth: Payer: Self-pay | Admitting: Oncology

## 2012-09-23 ENCOUNTER — Ambulatory Visit: Payer: Medicare Other | Admitting: Family

## 2012-09-25 NOTE — Progress Notes (Signed)
OFFICE PROGRESS NOTE  CC  CAMPBELL, [name], FNP [address] [place] [address] Dr. Dorothy Puffer Dr. 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  PRIOR THERAPY:  #1 in March 2014 patient presented to [facility] with dizziness slurred speech lower extremity edema and balance disturbances. She had CT that revealed brain mass and edema. She underwent a craniotomy on 06/07/2012 by Dr. Barnett Abu. The final pathology revealed renal cell carcinoma.  #2 subsequent staging studies including CT of the chest abdomen and pelvis revealed patient had a right renal cell mass along with some pulmonary metastasis and a left adrenal mass consistent with primary renal cell carcinoma.  #3 patient was seen by radiation oncology and she has now undergone SRS performed on 07/15/2012. Overall she tolerated it well without any problems.  CURRENT THERAPY: Sutent 25 mg daily 4 weeks on one week off starting 08/19/2012(holding  INTERVAL HISTORY: Sue Ray 72 y.o. female returns for followup visit. She is having ongoing issues with low-grade fevers. This is concerning. She has also questioned whether or not she should have her kidney taken out. She has a poor appetite. She does have pain off and on. She is swelling in her lower extremities as well. She's not been sleeping well at night. Remainder of the 10 point review of systems is negative.  MEDICAL HISTORY: Past Medical History  Diagnosis Date  . Macular degeneration   . Renal cell carcinoma   . Brain metastasis   . History of radiation therapy 07/15/2012    17 gray to right parietal, post-operative    ALLERGIES:  is allergic to sulfa antibiotics.  MEDICATIONS:  Current Outpatient Prescriptions  Medication Sig Dispense Refill  . beta carotene w/minerals (OCUVITE) tablet Take 1 tablet by mouth daily.      Marland Kitchen lisinopril (PRINIVIL,ZESTRIL) 10 MG tablet Take 1 tablet (10 mg  total) by mouth daily.  90 tablet  1  . Polyethyl Glycol-Propyl Glycol (SYSTANE) 0.4-0.3 % SOLN Apply 1 drop to eye.      Marland Kitchen azithromycin (ZITHROMAX Z-PAK) 250 MG tablet Take 2 tablets today and then 1 tablet daily for 4 days.  6 each  0  . ciprofloxacin (CIPRO) 500 MG tablet Take 1 tablet (500 mg total) by mouth 2 (two) times daily.  14 tablet  0  . furosemide (LASIX) 20 MG tablet Take 2 tablets (40 mg total) by mouth daily.  60 tablet  3  . HYDROcodone-acetaminophen (NORCO) 10-325 MG per tablet Take 1 tablet by mouth every 6 (six) hours as needed for pain.  120 tablet  0  . levETIRAcetam (KEPPRA) 500 MG tablet Take 1 tablet (500 mg total) by mouth 2 (two) times daily.  60 tablet  2  . mirtazapine (REMERON) 7.5 MG tablet Take 1 tablet (7.5 mg total) by mouth at bedtime.  30 tablet  3  . ondansetron (ZOFRAN) 8 MG tablet Take 1 tablet (8 mg total) by mouth every 8 (eight) hours as needed for nausea.  20 tablet  3  . potassium chloride (MICRO-K) 10 MEQ CR capsule Take 2 capsules (20 mEq total) by mouth daily.  60 capsule  0  . prochlorperazine (COMPAZINE) 10 MG tablet Take 1 tablet (10 mg total) by mouth every 6 (six) hours as needed.  30 tablet  3   No current facility-administered medications for this visit.    SURGICAL HISTORY:  Past Surgical History  Procedure Laterality Date  . Craniotomy Right 06/07/2012  Procedure: CRANIOTOMY TUMOR EXCISION;  Surgeon: Barnett Abu, MD;  Location: [facility];  Service: Neurosurgery;  Laterality: Right;  Right Parietal Craniotomy for Tumor with Stealth    REVIEW OF SYSTEMS:  Pertinent items are noted in HPI.   HEALTH MAINTENANCE:   PHYSICAL EXAMINATION: Blood pressure 117/67, pulse 98, temperature 97.7 F (36.5 C), temperature source Oral, resp. rate 20, height 4\' 10"  (1.473 m), weight 141 lb 6.4 oz (64.139 kg). Body mass index is 29.56 kg/(m^2). ECOG PERFORMANCE STATUS: 0 - Asymptomatic  Well-developed well-nourished female in no acute  distress HEENT exam EOMI PERRLA sclerae anicteric no conjunctival pallor oral mucosa is moist neck is supple lungs clear cardiovascular regular rate rhythm abdomen is soft protuberant there is fullness in the right renal area. Extremities no edema neuro patient's alert oriented no neuro focal deficits.   LABORATORY DATA: Lab Results  Component Value Date   WBC 2.6* 09/06/2012   HGB 12.8 09/06/2012   HCT 38.2 09/06/2012   MCV 87.8 09/06/2012   PLT 74 verified* 09/06/2012      Chemistry      Component Value Date/Time   NA 139 09/06/2012 0909   NA 129* 09/01/2012 1719   K 3.4* 09/06/2012 0909   K 3.1* 09/01/2012 1719   CL 104 09/06/2012 0909   CL 92* 09/01/2012 1719   CO2 24 09/06/2012 0909   CO2 24 09/01/2012 1719   BUN 9.2 09/06/2012 0909   BUN 10 09/01/2012 1719   CREATININE 0.6 09/06/2012 0909   CREATININE 0.69 09/01/2012 1719      Component Value Date/Time   CALCIUM 8.4 09/06/2012 0909   CALCIUM 9.1 09/01/2012 1719   ALKPHOS 80 09/01/2012 1719   ALKPHOS 87 08/26/2012 1124   AST 35 09/01/2012 1719   AST 21 08/26/2012 1124   ALT 25 09/01/2012 1719   ALT 19 08/26/2012 1124   BILITOT 0.4 09/01/2012 1719   BILITOT 0.35 08/26/2012 1124       RADIOGRAPHIC STUDIES:  Mr Laqueta Jean ZO Contrast  07/12/2012  *RADIOLOGY REPORT*  Clinical Data: 72 year old female with metastatic renal cell carcinoma.  Right hemisphere brain metastasis status post craniotomy and resection on 06/07/2012.  Stereotactic radiosurgery targeting requested.  MRI HEAD WITHOUT AND WITH CONTRAST  Technique:  Multiplanar, multiecho pulse sequences of the brain and surrounding structures were obtained according to standard protocol without and with intravenous contrast  Contrast: 12mL MULTIHANCE GADOBENATE DIMEGLUMINE 529 MG/ML IV SOLN  Comparison: Preoperative stereotactic CT head with contrast 06/06/2012.  Preoperative brain MRI 06/05/2012.  Findings: Cystic resection site in the right parietal lobe with a small volume circumferential  hemosiderin/blood products. Mildly nodular restricted diffusion along the inferomedial resection cavity (series 400 image 24).  Small nodular foci of enhancement along the resection cavity.  See series 10 images 90 - 104, as well as postcontrast sagittal and coronal images.   Overlying craniotomy with a smooth dural thickening and enhancement which is likely postoperative.  Postoperatively, right hemisphere cerebral edema has nearly resolved with a small area of local continued T2 and FLAIR hyperintensity surrounding resection cavity.  Mass effect and midline shift has resolved.  No other abnormal intracranial enhancement is identified.  No ventriculomegaly. Major intracranial vascular flow voids are stable.  Stable mild additional cerebral white matter nonspecific T2 and FLAIR hyperintensity.  Negative pituitary, cervicomedullary junction and visualized cervical spine.  Visualized bone marrow signal is normal.  Postoperative changes to the globes. Visualized paranasal sinuses and mastoids are clear.  No acute scalp  soft tissue findings.  IMPRESSION: 1.  Status post resection of right parietal metastasis.  Mild peripheral nodular enhancement at the cystic resection cavity.  See series 10, series 11 and series 12. And a small focus of restricted diffusion on series 400 image 24 might reflect residual hypercellularity. 2.  No other brain metastasis identified. 3.  Interval resolved intracranial mass effect.  Markedly diminished edema in the right parietal lobe.   Original Report Authenticated By: Erskine Speed, M.D.     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 Patient is status post craniotomy on 06/07/2012 by Dr. Danielle Dess. The final pathology revealed renal cell carcinoma. Postoperatively she has done well. She  #2 patient is also status post SRS by Dr. Dorothy Puffer on 07/15/2012. Overall she tolerated it well and is without any problems.  #3 patient is now  recommended systemic palliative treatments with targeted therapies such as Sutent. We discussed rationale of palliative treatment for his renal cell carcinoma. They do understand that there is no cure for this however we certainly could continue to treat her as long as she tolerates treatment and there is no progressive disease. Plan is to begin the Sutent at a medium dose of 25 mg 4 weeks on 2 weeks off. We discussed side effects benefits and rationale.  #4 patient began Sutent one week ago on 08/19/2012. She is on 25 mg daily 4 weeks on 2 weeks off.Patient unfortunately after a few days developed diarrhea and significant issues. The treatment was discontinued. They're still wondering if she should have a nephrectomy performed. I have recommended that this would be a discussion to have with the urologist. And a referral has been made.  PLAN:  #1 I have referred you to Dr. Isabel Caprice for urologic evaluation.  #2 Order CT of the abdomen and pelvis as well as a chest x-ray to evaluate for any evidence of progressive disease or infections.  #3 he will proceed with receiving IV fluids today.  #3 fevers of unclear etiology I will put you on empiric antibiotics consisting of Cipro twice a day. Your fevers may be due to you being neutropenic because of the chemotherapy he received.  #4 if you continue to develop fevers and chills please report to the nearest emergency room.   All questions were answered. The patient knows to call the clinic with any problems, questions or concerns. We can certainly see the patient much sooner if necessary.  I spent 30 minutes counseling the patient face to face. The total time spent in the appointment was 30 minutes.    Drue Second, MD Medical/Oncology [facility] [phone] (beeper) [phone] (Office)

## 2012-09-26 ENCOUNTER — Ambulatory Visit: Payer: Medicare Other | Admitting: Adult Health

## 2012-09-26 ENCOUNTER — Other Ambulatory Visit: Payer: Medicare Other | Admitting: Lab

## 2012-09-29 ENCOUNTER — Telehealth: Payer: Self-pay | Admitting: Family

## 2012-09-29 NOTE — Telephone Encounter (Signed)
Pt needs refills on her furosemide (LASIX) 20 MG tablet Pt uses [facility] on [address].  I informed pt that even though no refills remain, next time they can call the pharmacy.

## 2012-09-29 NOTE — Telephone Encounter (Signed)
Rx was just filled on 09/12/12 for #60 with 3 refills.  Pt aware and I am calling pharmacy to clarify.  Pharmacy notes pt does have 3 refills and will fill now

## 2012-10-05 ENCOUNTER — Other Ambulatory Visit: Payer: Self-pay | Admitting: Adult Health

## 2012-10-11 ENCOUNTER — Telehealth: Payer: Self-pay | Admitting: Family

## 2012-10-11 DIAGNOSIS — J811 Chronic pulmonary edema: Secondary | ICD-10-CM

## 2012-10-11 NOTE — Telephone Encounter (Signed)
Patient would like refills on:  HYDROcodone-acetaminophen (NORCO) 10-325 MG per tablet potassium chloride (MICRO-K) 10 MEQ CR capsule  [facility] [address]. Dr Welton Flakes in Oncology filled the KCl before, and that's where [facility] sent the refill request. However, pt no longer sees that doctor, and would like [facility] to fill. Please advise.

## 2012-10-11 NOTE — Telephone Encounter (Signed)
Norco last filled 09/12/2012 for #120. Ok to fill?

## 2012-10-12 MED ORDER — HYDROCODONE-ACETAMINOPHEN 10-325 MG PO TABS: 1.0000 | ORAL_TABLET | Freq: Four times a day (QID) | ORAL | Status: DC | PRN

## 2012-10-12 MED ORDER — POTASSIUM CHLORIDE ER 10 MEQ PO CPCR: 20.0000 meq | ORAL_CAPSULE | Freq: Every day | ORAL | Status: DC

## 2012-10-12 NOTE — Telephone Encounter (Signed)
Done

## 2012-10-12 NOTE — Telephone Encounter (Signed)
She has cancer. Ok to fill

## 2012-10-13 ENCOUNTER — Other Ambulatory Visit: Payer: Self-pay | Admitting: Family

## 2012-10-19 ENCOUNTER — Other Ambulatory Visit: Payer: Self-pay

## 2012-10-21 ENCOUNTER — Telehealth: Payer: Self-pay | Admitting: Oncology

## 2012-10-21 ENCOUNTER — Ambulatory Visit
Admission: RE | Admit: 2012-10-21 | Discharge: 2012-10-21 | Disposition: A | Discharge status: Home / Self Care | Payer: Medicare Other | Source: Ambulatory Visit | Attending: Radiation Oncology | Admitting: Radiation Oncology

## 2012-10-21 DIAGNOSIS — C7931 Secondary malignant neoplasm of brain: Secondary | ICD-10-CM

## 2012-10-21 MED ORDER — GADOBENATE DIMEGLUMINE 529 MG/ML IV SOLN
12.0000 mL | Freq: Once | INTRAVENOUS | Status: AC | PRN
  Administered 2012-10-21: 12 mL via INTRAVENOUS

## 2012-10-21 NOTE — Telephone Encounter (Signed)
Missed appts from 7/14 r/s w/LA per Triage. lmonvm for pt re appt for 8/20 and mailed schedule.

## 2012-10-24 ENCOUNTER — Telehealth: Payer: Self-pay | Admitting: Oncology

## 2012-11-02 ENCOUNTER — Other Ambulatory Visit: Payer: Medicare Other | Admitting: Lab

## 2012-11-02 ENCOUNTER — Ambulatory Visit: Payer: Medicare Other | Admitting: Adult Health

## 2012-11-09 ENCOUNTER — Other Ambulatory Visit: Payer: Self-pay | Admitting: Family

## 2012-11-18 ENCOUNTER — Other Ambulatory Visit: Payer: Self-pay | Admitting: Radiation Therapy

## 2012-11-18 DIAGNOSIS — C7931 Secondary malignant neoplasm of brain: Secondary | ICD-10-CM

## 2012-12-04 ENCOUNTER — Other Ambulatory Visit: Payer: Self-pay | Admitting: Family

## 2012-12-05 ENCOUNTER — Other Ambulatory Visit: Payer: Self-pay | Admitting: Family

## 2012-12-08 ENCOUNTER — Other Ambulatory Visit: Payer: Self-pay | Admitting: Family

## 2013-01-08 ENCOUNTER — Other Ambulatory Visit: Payer: Self-pay | Admitting: Family

## 2013-01-09 ENCOUNTER — Other Ambulatory Visit: Payer: Self-pay | Admitting: Family

## 2013-01-19 ENCOUNTER — Other Ambulatory Visit: Payer: Self-pay

## 2013-01-20 ENCOUNTER — Other Ambulatory Visit: Payer: Medicare Other

## 2013-01-23 ENCOUNTER — Ambulatory Visit: Payer: Medicare Other | Admitting: Radiation Oncology

## 2013-01-23 ENCOUNTER — Other Ambulatory Visit: Payer: Self-pay | Admitting: Family

## 2013-01-24 ENCOUNTER — Ambulatory Visit
Admission: RE | Admit: 2013-01-24 | Discharge: 2013-01-24 | Disposition: A | Discharge status: Home / Self Care | Payer: Medicare Other | Source: Ambulatory Visit | Attending: Radiation Oncology | Admitting: Radiation Oncology

## 2013-01-24 DIAGNOSIS — C7931 Secondary malignant neoplasm of brain: Secondary | ICD-10-CM

## 2013-01-24 MED ORDER — GADOBENATE DIMEGLUMINE 529 MG/ML IV SOLN
12.0000 mL | Freq: Once | INTRAVENOUS | Status: AC | PRN
  Administered 2013-01-24: 12 mL via INTRAVENOUS

## 2013-01-25 ENCOUNTER — Other Ambulatory Visit: Payer: Self-pay | Admitting: Family

## 2013-01-25 ENCOUNTER — Ambulatory Visit: Payer: Medicare Other | Attending: Radiation Oncology | Admitting: Radiation Oncology

## 2013-01-30 ENCOUNTER — Encounter: Payer: Self-pay | Admitting: Family

## 2013-01-31 ENCOUNTER — Ambulatory Visit: Payer: Medicare Other | Admitting: Family

## 2013-02-06 ENCOUNTER — Other Ambulatory Visit: Payer: Self-pay | Admitting: Family

## 2013-02-07 ENCOUNTER — Other Ambulatory Visit: Payer: Self-pay | Admitting: Family

## 2013-02-14 ENCOUNTER — Encounter: Payer: Self-pay | Admitting: Family

## 2013-02-14 ENCOUNTER — Ambulatory Visit (INDEPENDENT_AMBULATORY_CARE_PROVIDER_SITE_OTHER): Payer: Medicare Other | Admitting: Family

## 2013-02-14 VITALS — BP 104/60 | HR 55 | Wt 132.0 lb

## 2013-02-14 DIAGNOSIS — I10 Essential (primary) hypertension: Secondary | ICD-10-CM

## 2013-02-14 DIAGNOSIS — C641 Malignant neoplasm of right kidney, except renal pelvis: Secondary | ICD-10-CM

## 2013-02-14 DIAGNOSIS — C649 Malignant neoplasm of unspecified kidney, except renal pelvis: Secondary | ICD-10-CM

## 2013-02-14 DIAGNOSIS — E876 Hypokalemia: Secondary | ICD-10-CM

## 2013-02-14 MED ORDER — POTASSIUM CHLORIDE ER 10 MEQ PO CPCR: 10.0000 meq | ORAL_CAPSULE | Freq: Two times a day (BID) | ORAL | Status: DC

## 2013-02-14 MED ORDER — MIRTAZAPINE 7.5 MG PO TABS: ORAL_TABLET | ORAL | Status: DC

## 2013-02-14 MED ORDER — LEVETIRACETAM 500 MG PO TABS: ORAL_TABLET | ORAL | Status: DC

## 2013-02-14 NOTE — Progress Notes (Signed)
Subjective:    Patient ID: Sue Ray, female    DOB: 1940/08/14, 72 y.o.   MRN: [id number]  HPI 72 year old white female, nonsmoker is in today for recheck of hypertension, renal cell carcinoma with metastasis to the brain that has been improving. She is currently being seen at [facility] for management and tolerating therapy well. She continues to take Remeron for depression. Reports tolerating chemotherapy well except feeling fatigued.   Review of Systems  Constitutional: Negative.   HENT: Negative.   Respiratory: Negative.   Cardiovascular: Negative.   Gastrointestinal: Negative.   Endocrine: Negative.   Genitourinary: Negative.   Musculoskeletal: Negative.   Skin: Negative.   Neurological: Negative.   Hematological: Negative.   Psychiatric/Behavioral: Negative.    Past Medical History  Diagnosis Date  . Macular degeneration   . Renal cell carcinoma   . Brain metastasis   . History of radiation therapy 07/15/2012    17 gray to right parietal, post-operative    History   Social History  . Marital Status: Married    Spouse Name: N/A    Number of Children: N/A  . Years of Education: N/A   Occupational History  . Not on file.   Social History Main Topics  . Smoking status: Former Smoker    Types: Cigarettes    Quit date: 01/15/2012  . Smokeless tobacco: Never Used  . Alcohol Use: Yes     Comment: Rare  . Drug Use: No  . Sexual Activity: No     Comment: electronic cigarette   Other Topics Concern  . Not on file   Social History Narrative  . No narrative on file    Past Surgical History  Procedure Laterality Date  . Craniotomy Right 06/07/2012    Procedure: CRANIOTOMY TUMOR EXCISION;  Surgeon: Barnett Abu, MD;  Location: [facility];  Service: Neurosurgery;  Laterality: Right;  Right Parietal Craniotomy for Tumor with Stealth    Family History  Problem Relation Age of Onset  . Brain cancer Neg Hx   . Hypertension  Brother   . Hyperlipidemia Brother   . Parkinson's disease Brother   . Cancer Brother     Lung cancer  . Heart attack Brother     Allergies  Allergen Reactions  . Sulfa Antibiotics Swelling    nervous    Current Outpatient Prescriptions on File Prior to Visit  Medication Sig Dispense Refill  . beta carotene w/minerals (OCUVITE) tablet Take 1 tablet by mouth daily.      . ciprofloxacin (CIPRO) 500 MG tablet Take 1 tablet (500 mg total) by mouth 2 (two) times daily.  14 tablet  0  . furosemide (LASIX) 20 MG tablet TAKE TWO TABLETS BY MOUTH ONCE DAILY.  60 tablet  0  . furosemide (LASIX) 20 MG tablet TAKE TWO TABLETS BY MOUTH ONCE DAILY.  60 tablet  0  . HYDROcodone-acetaminophen (NORCO) 10-325 MG per tablet TAKE ONE TABLET BY MOUTH EVERY 6 HOURS AS NEEDED FOR PAIN  120 tablet  0  . lisinopril (PRINIVIL,ZESTRIL) 10 MG tablet TAKE ONE TABLET BY MOUTH ONCE DAILY  15 tablet  0  . ondansetron (ZOFRAN) 8 MG tablet Take 1 tablet (8 mg total) by mouth every 8 (eight) hours as needed for nausea.  20 tablet  3  . Polyethyl Glycol-Propyl Glycol (SYSTANE) 0.4-0.3 % SOLN Apply 1 drop to eye.      . prochlorperazine (COMPAZINE) 10 MG tablet Take 1 tablet (10 mg  total) by mouth every 6 (six) hours as needed.  30 tablet  3  . azithromycin (ZITHROMAX Z-PAK) 250 MG tablet Take 2 tablets today and then 1 tablet daily for 4 days.  6 each  0   No current facility-administered medications on file prior to visit.    BP 104/60  Pulse 55  Wt 132 lb (59.875 kg)chart    Objective:   Physical Exam  Constitutional: She is oriented to person, place, and time. She appears well-developed and well-nourished.  HENT:  Right Ear: External ear normal.  Left Ear: External ear normal.  Nose: Nose normal.  Mouth/Throat: Oropharynx is clear and moist.  Neck: Normal range of motion. Neck supple.  Cardiovascular: Normal rate, regular rhythm and normal heart sounds.   Pulmonary/Chest: Effort normal and breath sounds  normal.  Abdominal: Soft. Bowel sounds are normal.  Musculoskeletal: Normal range of motion.  Neurological: She is alert and oriented to person, place, and time.  Skin: Skin is warm and dry.  Psychiatric: She has a normal mood and affect.          Assessment & Plan:  Assessment: 1. Hypertension 2. Renal cell carcinoma with metastasis to the brain  Plan: Will obtain labs from [facility]. Continue current medications. Recheck patient in 6 months and sooner as needed. And

## 2013-02-14 NOTE — Progress Notes (Signed)
Pre visit review using our clinic review tool, if applicable. No additional management support is needed unless otherwise documented below in the visit note. 

## 2013-02-20 ENCOUNTER — Other Ambulatory Visit: Payer: Self-pay | Admitting: Family

## 2013-03-13 ENCOUNTER — Other Ambulatory Visit: Payer: Self-pay | Admitting: Radiation Therapy

## 2013-03-13 DIAGNOSIS — C7931 Secondary malignant neoplasm of brain: Secondary | ICD-10-CM

## 2013-03-23 ENCOUNTER — Other Ambulatory Visit: Payer: Self-pay | Admitting: Family

## 2013-04-11 ENCOUNTER — Other Ambulatory Visit: Payer: Self-pay | Admitting: Radiation Therapy

## 2013-04-11 DIAGNOSIS — C7949 Secondary malignant neoplasm of other parts of nervous system: Principal | ICD-10-CM

## 2013-04-11 DIAGNOSIS — C7931 Secondary malignant neoplasm of brain: Secondary | ICD-10-CM

## 2013-04-19 ENCOUNTER — Ambulatory Visit
Admission: RE | Admit: 2013-04-19 | Discharge: 2013-04-19 | Disposition: A | Discharge status: Home / Self Care | Payer: Medicare Other | Source: Ambulatory Visit | Attending: Radiation Oncology | Admitting: Radiation Oncology

## 2013-04-19 DIAGNOSIS — C7949 Secondary malignant neoplasm of other parts of nervous system: Secondary | ICD-10-CM

## 2013-04-19 DIAGNOSIS — C649 Malignant neoplasm of unspecified kidney, except renal pelvis: Secondary | ICD-10-CM | POA: Insufficient documentation

## 2013-04-19 DIAGNOSIS — C7931 Secondary malignant neoplasm of brain: Secondary | ICD-10-CM | POA: Insufficient documentation

## 2013-04-19 LAB — BUN AND CREATININE (CC13)
BUN: 11.6 mg/dL (ref 7.0–26.0)
CREATININE: 0.7 mg/dL (ref 0.6–1.1)

## 2013-04-20 ENCOUNTER — Ambulatory Visit
Admission: RE | Admit: 2013-04-20 | Discharge: 2013-04-20 | Disposition: A | Discharge status: Home / Self Care | Payer: Medicare Other | Source: Ambulatory Visit | Attending: Radiation Oncology | Admitting: Radiation Oncology

## 2013-04-20 DIAGNOSIS — C7931 Secondary malignant neoplasm of brain: Secondary | ICD-10-CM

## 2013-04-20 DIAGNOSIS — C7949 Secondary malignant neoplasm of other parts of nervous system: Principal | ICD-10-CM

## 2013-04-20 MED ORDER — GADOBENATE DIMEGLUMINE 529 MG/ML IV SOLN: 12.0000 mL | Freq: Once | INTRAVENOUS | Status: DC | PRN

## 2013-04-20 MED ORDER — GADOBENATE DIMEGLUMINE 529 MG/ML IV SOLN
12.0000 mL | Freq: Once | INTRAVENOUS | Status: AC | PRN
  Administered 2013-04-20: 12 mL via INTRAVENOUS

## 2013-04-24 ENCOUNTER — Ambulatory Visit
Admission: RE | Admit: 2013-04-24 | Discharge: 2013-04-24 | Disposition: A | Discharge status: Home / Self Care | Payer: Medicare Other | Source: Ambulatory Visit | Attending: Radiation Oncology | Admitting: Radiation Oncology

## 2013-04-24 ENCOUNTER — Encounter: Payer: Self-pay | Admitting: Radiation Oncology

## 2013-04-24 VITALS — BP 108/60 | HR 84 | Temp 97.7°F | Resp 20 | Wt 133.5 lb

## 2013-04-24 DIAGNOSIS — C7931 Secondary malignant neoplasm of brain: Secondary | ICD-10-CM

## 2013-04-24 DIAGNOSIS — C7949 Secondary malignant neoplasm of other parts of nervous system: Principal | ICD-10-CM

## 2013-04-24 NOTE — Progress Notes (Signed)
Follow up  [place] brain, MRI results 04/20/13 here to discuss results ,  No nausea, h/a, is having fatigue from chemotherapy,  appetite good,  No pain 8:57 AM  8:57 AM

## 2013-04-24 NOTE — Progress Notes (Signed)
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  The patient returns to clinic today for followup. She indicates that she is doing very well. He continues on systemic treatment and her primary complaint is diarrhea which is well controlled with Imodium. She denies any headaches. No nausea. No vision changes. No weakness. The patient underwent a recent MRI scan of the brain and this was reviewed in multidisciplinary breast conference this morning.                              ALLERGIES:  is allergic to sulfa antibiotics.  Meds: Current Outpatient Prescriptions  Medication Sig Dispense Refill  . beta carotene w/minerals (OCUVITE) tablet Take 1 tablet by mouth daily.      . furosemide (LASIX) 20 MG tablet TAKE TWO TABLETS BY MOUTH ONCE DAILY.  60 tablet  0  . HYDROcodone-acetaminophen (NORCO) 10-325 MG per tablet TAKE ONE TABLET BY MOUTH EVERY 6 HOURS AS NEEDED FOR PAIN  120 tablet  0  . levETIRAcetam (KEPPRA) 500 MG tablet TAKE ONE TABLET BY MOUTH TWICE DAILY  90 tablet  1  . lisinopril (PRINIVIL,ZESTRIL) 10 MG tablet TAKE ONE TABLET BY MOUTH ONCE DAILY  90 tablet  0  . mirtazapine (REMERON) 7.5 MG tablet TAKE ONE TABLET BY MOUTH AT BEDTIME-NEEDS TO BE SEEN  90 tablet  1  . ondansetron (ZOFRAN) 8 MG tablet Take 1 tablet (8 mg total) by mouth every 8 (eight) hours as needed for nausea.  20 tablet  3  . Polyethyl Glycol-Propyl Glycol (SYSTANE) 0.4-0.3 % SOLN Apply 1 drop to eye.      . potassium chloride (MICRO-K) 10 MEQ CR capsule Take 1 capsule (10 mEq total) by mouth 2 (two) times daily.  180 capsule  1  . prochlorperazine (COMPAZINE) 10  MG tablet Take 1 tablet (10 mg total) by mouth every 6 (six) hours as needed.  30 tablet  3   No current facility-administered medications for this encounter.    Physical Findings: The patient is in no acute distress. Patient is alert and oriented.  weight is 133 lb 8 oz (60.555 kg). Her oral temperature is 97.7 F (36.5 C). Her blood pressure is 108/60 and her pulse is 84. Her respiration is 20 and oxygen saturation is 98%. .   General: Well-developed, in no acute distress HEENT: Normocephalic, atraumatic Cardiovascular: Regular rate and rhythm Respiratory: Clear to auscultation bilaterally GI: Soft, nontender, normal bowel sounds Extremities: No edema present   Lab Findings: Lab Results  Component Value Date   WBC 2.6* 09/06/2012   HGB 12.8 09/06/2012   HCT 38.2 09/06/2012   MCV 87.8 09/06/2012   PLT 74 verified* 09/06/2012     Radiographic Findings: Mr Jeri Cos CV Contrast  04/20/2013   CLINICAL DATA:  Renal carcinoma. Nine month restaging stereotactic radiosurgery.  EXAM: MRI HEAD WITHOUT AND WITH CONTRAST  TECHNIQUE: Multiplanar, multiecho pulse sequences of the brain and surrounding structures were obtained without and with intravenous contrast.  CONTRAST:  44mL MULTIHANCE GADOBENATE DIMEGLUMINE 529 MG/ML IV SOLN  COMPARISON:  MRI 01/24/2013  FINDINGS: Surgical resection cavity right parietal lobe is stable. There was a small amount of intrinsic T1 shortening in the superior medial aspect of the cavity likely related to hemorrhage which has resolved. There is mild hemosiderin lining the surgical cavity. There is white matter hyperintensity surrounding the surgical cavity unchanged,. No recurrent enhancing mass lesion is seen in this area.  No new metastatic deposits are identified. No enhancing mass lesion is identified.  Generalized atrophy with mild chronic microvascular ischemic change. No acute infarct.  Mucosal thickening in the paranasal sinuses.  IMPRESSION: Stable postsurgical  changes right parietal lobe. No recurrent mass lesion. No evidence of metastatic disease.   Electronically Signed   By: Franchot Gallo M.D.   On: 04/20/2013 13:19    Impression:    The patient is doing very well clinically. Her MRI scan looked good with no signs of recurrence and no evidence of metastatic disease intracranially.  Plan:  The patient will undergo a repeat MRI scan of the brain in 3 months, then followup.   Jodelle Gross, M.D., Ph.D.

## 2013-05-09 ENCOUNTER — Other Ambulatory Visit: Payer: Self-pay | Admitting: Radiation Therapy

## 2013-05-12 ENCOUNTER — Other Ambulatory Visit: Payer: Self-pay | Admitting: Radiation Therapy

## 2013-05-12 DIAGNOSIS — C7931 Secondary malignant neoplasm of brain: Secondary | ICD-10-CM

## 2013-05-12 DIAGNOSIS — C7949 Secondary malignant neoplasm of other parts of nervous system: Principal | ICD-10-CM

## 2013-05-20 ENCOUNTER — Other Ambulatory Visit: Payer: Self-pay | Admitting: Family

## 2013-05-22 ENCOUNTER — Other Ambulatory Visit: Payer: Self-pay | Admitting: Family

## 2013-07-20 ENCOUNTER — Telehealth: Payer: Self-pay | Admitting: Family

## 2013-07-20 NOTE — Telephone Encounter (Signed)
[facility] [place], [place] - [address] [phone]

## 2013-07-21 ENCOUNTER — Ambulatory Visit
Admission: RE | Admit: 2013-07-21 | Discharge: 2013-07-21 | Disposition: A | Discharge status: Home / Self Care | Payer: Medicare Other | Source: Ambulatory Visit | Attending: Radiation Oncology | Admitting: Radiation Oncology

## 2013-07-21 DIAGNOSIS — C7931 Secondary malignant neoplasm of brain: Secondary | ICD-10-CM

## 2013-07-21 DIAGNOSIS — C7949 Secondary malignant neoplasm of other parts of nervous system: Principal | ICD-10-CM

## 2013-07-21 MED ORDER — GADOBENATE DIMEGLUMINE 529 MG/ML IV SOLN
12.0000 mL | Freq: Once | INTRAVENOUS | Status: AC | PRN
  Administered 2013-07-21: 12 mL via INTRAVENOUS

## 2013-07-24 ENCOUNTER — Other Ambulatory Visit: Payer: Self-pay | Admitting: Radiation Therapy

## 2013-07-24 ENCOUNTER — Encounter: Payer: Self-pay | Admitting: Radiation Oncology

## 2013-07-24 ENCOUNTER — Ambulatory Visit
Admission: RE | Admit: 2013-07-24 | Discharge: 2013-07-24 | Disposition: A | Discharge status: Home / Self Care | Payer: Medicare Other | Source: Ambulatory Visit | Attending: Radiation Oncology | Admitting: Radiation Oncology

## 2013-07-24 VITALS — BP 120/59 | HR 90 | Temp 97.6°F | Resp 20 | Wt 132.5 lb

## 2013-07-24 DIAGNOSIS — C7931 Secondary malignant neoplasm of brain: Secondary | ICD-10-CM

## 2013-07-24 DIAGNOSIS — C7949 Secondary malignant neoplasm of other parts of nervous system: Principal | ICD-10-CM

## 2013-07-24 NOTE — Progress Notes (Signed)
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 The patient is status post postoperative radiation treatment to a right parietal target on 07/15/2012.    Narrative:  The patient returns today for routine follow-up.  The patient states that she is doing well. No headache and no nausea recently. With regards to systemic treatment, the patient is continuing on systemic treatment at [facility]. She is pleased with how she is doing.  The patient underwent a recent MRI scan of the brain. This has been reviewed in multidisciplinary brain conference. No signs of recurrent or new disease seen. I have personally reviewed this MRI scan.                              ALLERGIES:  is allergic to sulfa antibiotics and sutent.  Meds: Current Outpatient Prescriptions  Medication Sig Dispense Refill  . beta carotene w/minerals (OCUVITE) tablet Take 1 tablet by mouth daily.      . furosemide (LASIX) 20 MG tablet TAKE TWO TABLETS BY MOUTH ONCE DAILY.  60 tablet  0  . HYDROcodone-acetaminophen (NORCO) 10-325 MG per tablet TAKE ONE TABLET BY MOUTH EVERY 6 HOURS AS NEEDED FOR PAIN  120 tablet  0  . levETIRAcetam (KEPPRA) 500 MG tablet TAKE ONE TABLET BY MOUTH TWICE DAILY  180 tablet  0  . lisinopril (PRINIVIL,ZESTRIL) 10 MG tablet TAKE ONE TABLET BY MOUTH ONCE DAILY  90 tablet  0  . loperamide (IMODIUM) 2 MG capsule Take 2 mg by mouth daily.      . mirtazapine (REMERON) 7.5 MG tablet TAKE ONE TABLET BY MOUTH AT BEDTIME-NEEDS TO BE SEEN  90 tablet  1  . Polyethyl Glycol-Propyl Glycol (SYSTANE) 0.4-0.3 % SOLN Apply 1 drop to eye.      . potassium chloride (MICRO-K) 10 MEQ CR capsule Take 1 capsule (10 mEq total) by mouth 2 (two) times daily.  180 capsule  1    No current facility-administered medications for this encounter.    Physical Findings: The patient is in no acute distress. Patient is alert and oriented.  weight is 132 lb 8 oz (60.102 kg). Her oral temperature is 97.6 F (36.4 C). Her blood pressure is 120/59 and her pulse is 90. Her respiration is 20 and oxygen saturation is 98%. .     Lab Findings: Lab Results  Component Value Date   WBC 2.6* 09/06/2012   HGB 12.8 09/06/2012   HCT 38.2 09/06/2012   MCV 87.8 09/06/2012   PLT 74 verified* 09/06/2012     Radiographic Findings: Mr Jeri Cos ME Contrast  07/21/2013   CLINICAL DATA:  Subsequent encounter for renal cell carcinoma with brain metastases status post open surgery and SRS.  EXAM: MRI HEAD WITHOUT AND WITH CONTRAST  TECHNIQUE: Multiplanar, multiecho pulse sequences of the brain and surrounding structures were obtained without and with intravenous contrast.  CONTRAST:  90mL MULTIHANCE GADOBENATE DIMEGLUMINE 529 MG/ML IV SOLN  COMPARISON:  MRI brain 04/20/2013  FINDINGS: Patient is status post right parietal craniotomy. The post resection cavity is stable. There is not neurologic enhancement of the brain or meninges to suggest metastatic disease. There is no evidence for more recurring disease at the  operative site.  T2 changes surrounding the surgical cavity are stable. Mild atrophy and white matter disease is otherwise unchanged.  No acute infarct, hemorrhage, or mass lesion is present. The ventricles are of normal size. Ex vacuo dilation of the right lateral ventricle is stable. No significant extraaxial fluid collection is present.  Flow is present in the major intracranial arteries. The patient is status post bilateral lens replacements. The globes and orbits are otherwise intact. The paranasal sinuses and mastoid air cells are clear.  IMPRESSION: 1. Stable postoperative changes of right parietal craniotomy for resection of tumor without evidence for residual or recurrent disease. 2. No  other pathologic enhancement to suggest recurrent metastases. 3. No acute intracranial abnormality.   Electronically Signed   By: Lawrence Santiago M.D.   On: 07/21/2013 15:15    Impression:    The patient is doing well clinically. The patient's brain MRI scan looked very good. We will continue standard followup through the brain program.  Plan:  The patient will undergo a repeat MRI scan of the brain in 3 months.   I spent 15 minutes with the patient today, the majority of which was spent counseling the patient on the diagnosis of cancer and coordinating care.  Jodelle Gross, M.D., Ph.D.

## 2013-07-24 NOTE — Progress Notes (Signed)
Follow up here for M RI result 07/21/13, , s/p SRS brain, no nausea, no head ache, no pain , no vision changes, no dizzyness, occasional sob, 98% room air, is down to 2 chemotherapys , allergic to Sutent, stopped that,  Energy okay stated patient 9:37 AM

## 2013-07-25 MED ORDER — FUROSEMIDE 20 MG PO TABS: ORAL_TABLET | ORAL | Status: DC

## 2013-07-25 NOTE — Telephone Encounter (Signed)
Pt needs refill on furosemide 20 mg #180 for 90 day supply.

## 2013-07-25 NOTE — Telephone Encounter (Signed)
Rx sent 

## 2013-08-17 ENCOUNTER — Other Ambulatory Visit: Payer: Self-pay | Admitting: Family

## 2013-08-21 ENCOUNTER — Other Ambulatory Visit: Payer: Self-pay | Admitting: Family

## 2013-08-24 ENCOUNTER — Ambulatory Visit (INDEPENDENT_AMBULATORY_CARE_PROVIDER_SITE_OTHER): Payer: Medicare Other | Admitting: Family

## 2013-08-24 ENCOUNTER — Encounter: Payer: Self-pay | Admitting: Family

## 2013-08-24 ENCOUNTER — Ambulatory Visit (HOSPITAL_COMMUNITY)
Admission: RE | Admit: 2013-08-24 | Discharge: 2013-08-24 | Disposition: A | Discharge status: Home / Self Care | Payer: Medicare Other | Source: Ambulatory Visit | Attending: Family | Admitting: Family

## 2013-08-24 VITALS — BP 90/60 | HR 88 | Temp 97.9°F | Ht <= 58 in | Wt 132.1 lb

## 2013-08-24 DIAGNOSIS — M25579 Pain in unspecified ankle and joints of unspecified foot: Secondary | ICD-10-CM

## 2013-08-24 DIAGNOSIS — R609 Edema, unspecified: Secondary | ICD-10-CM

## 2013-08-24 DIAGNOSIS — M25476 Effusion, unspecified foot: Secondary | ICD-10-CM | POA: Insufficient documentation

## 2013-08-24 DIAGNOSIS — W108XXA Fall (on) (from) other stairs and steps, initial encounter: Secondary | ICD-10-CM

## 2013-08-24 DIAGNOSIS — M25473 Effusion, unspecified ankle: Secondary | ICD-10-CM | POA: Insufficient documentation

## 2013-08-24 DIAGNOSIS — R0602 Shortness of breath: Secondary | ICD-10-CM

## 2013-08-24 DIAGNOSIS — M25571 Pain in right ankle and joints of right foot: Secondary | ICD-10-CM

## 2013-08-24 DIAGNOSIS — M7730 Calcaneal spur, unspecified foot: Secondary | ICD-10-CM | POA: Insufficient documentation

## 2013-08-24 LAB — POCT URINALYSIS DIPSTICK
BILIRUBIN UA: NEGATIVE
Blood, UA: NEGATIVE
Glucose, UA: NEGATIVE
KETONES UA: NEGATIVE
NITRITE UA: NEGATIVE
PROTEIN UA: NEGATIVE
Spec Grav, UA: 1.01
Urobilinogen, UA: 0.2
pH, UA: 5.5

## 2013-08-24 LAB — BRAIN NATRIURETIC PEPTIDE: Pro B Natriuretic peptide (BNP): 61 pg/mL (ref 0.0–100.0)

## 2013-08-24 LAB — BASIC METABOLIC PANEL
BUN: 8 mg/dL (ref 6–23)
CHLORIDE: 103 meq/L (ref 96–112)
CO2: 27 mEq/L (ref 19–32)
Calcium: 8.8 mg/dL (ref 8.4–10.5)
Creatinine, Ser: 0.7 mg/dL (ref 0.4–1.2)
GFR: 82.69 mL/min (ref >60.00)
Glucose, Bld: 136 mg/dL — ABNORMAL HIGH (ref 70–99)
POTASSIUM: 3.3 meq/L — AB (ref 3.5–5.1)
Sodium: 138 mEq/L (ref 135–145)

## 2013-08-24 MED ORDER — FUROSEMIDE 20 MG PO TABS: ORAL_TABLET | ORAL | Status: DC

## 2013-08-24 NOTE — Progress Notes (Signed)
Pre visit review using our clinic review tool, if applicable. No additional management support is needed unless otherwise documented below in the visit note. 

## 2013-08-24 NOTE — Progress Notes (Signed)
Subjective:    Patient ID: Sue Ray, female    DOB: 1940-03-23, 73 y.o.   MRN: [id number]  HPI   73 year old white female, nonsmoker is in today with complaints of peripheral edema ongoing x3-4 weeks. Denies any chest pain. No increase in sodium intake as she is aware of. Has mild shortness of breath but no chest pain. She's currently on Lasix 40 mg daily. She is under the care of oncology at [facility] for renal cell carcinoma. Also reports having a fall yesterday down 2-3 steps enrolled her right ankle over. She now has swelling is more prominent in the right foot. Otherwise, no other injuries. Pain is a 6/10, worse with walking. Has been elevating her legs that has helped.  Review of Systems  Constitutional: Negative.   HENT: Negative.   Respiratory: Negative.   Cardiovascular: Negative.   Gastrointestinal: Negative.   Endocrine: Negative.   Genitourinary: Negative.   Musculoskeletal: Positive for arthralgias.       Foot swelling   Skin: Negative.   Neurological: Negative.   Hematological: Negative.   Psychiatric/Behavioral: Negative.    Past Medical History  Diagnosis Date  . Macular degeneration   . Renal cell carcinoma   . Brain metastasis   . History of radiation therapy 07/15/2012    17 gray to right parietal, post-operative    History   Social History  . Marital Status: Married    Spouse Name: N/A    Number of Children: N/A  . Years of Education: N/A   Occupational History  . Not on file.   Social History Main Topics  . Smoking status: Former Smoker    Types: Cigarettes    Quit date: 01/15/2012  . Smokeless tobacco: Never Used  . Alcohol Use: Yes     Comment: Rare  . Drug Use: No  . Sexual Activity: No     Comment: electronic cigarette   Other Topics Concern  . Not on file   Social History Narrative  . No narrative on file    Past Surgical History  Procedure Laterality Date  . Craniotomy Right 06/07/2012    Procedure: CRANIOTOMY  TUMOR EXCISION;  Surgeon: Kristeen Miss, MD;  Location: [facility];  Service: Neurosurgery;  Laterality: Right;  Right Parietal Craniotomy for Tumor with Stealth    Family History  Problem Relation Age of Onset  . Brain cancer Neg Hx   . Hypertension Brother   . Hyperlipidemia Brother   . Parkinson's disease Brother   . Cancer Brother     Lung cancer  . Heart attack Brother     Allergies  Allergen Reactions  . Sulfa Antibiotics Swelling    nervous  . Sutent [Sunitinib] Shortness Of Breath    Current Outpatient Prescriptions on File Prior to Visit  Medication Sig Dispense Refill  . beta carotene w/minerals (OCUVITE) tablet Take 1 tablet by mouth daily.      Marland Kitchen HYDROcodone-acetaminophen (NORCO) 10-325 MG per tablet TAKE ONE TABLET BY MOUTH EVERY 6 HOURS AS NEEDED FOR PAIN  120 tablet  0  . levETIRAcetam (KEPPRA) 500 MG tablet TAKE ONE TABLET BY MOUTH TWICE DAILY.  60 tablet  0  . lisinopril (PRINIVIL,ZESTRIL) 10 MG tablet TAKE ONE TABLET BY MOUTH ONCE DAILY  30 tablet  0  . loperamide (IMODIUM) 2 MG capsule Take 2 mg by mouth daily.      . mirtazapine (REMERON) 7.5 MG tablet TAKE ONE TABLET BY MOUTH AT BEDTIME  30 tablet  0  . Polyethyl Glycol-Propyl Glycol (SYSTANE) 0.4-0.3 % SOLN Apply 1 drop to eye.      . potassium chloride (MICRO-K) 10 MEQ CR capsule Take 1 capsule (10 mEq total) by mouth 2 (two) times daily.  180 capsule  1   No current facility-administered medications on file prior to visit.    BP 90/60  Pulse 88  Temp(Src) 97.9 F (36.6 C) (Oral)  Ht 4\' 10"  (1.473 m)  Wt 132 lb 1.6 oz (59.92 kg)  BMI 27.62 kg/m2  SpO2 98%chart    Objective:   Physical Exam  Constitutional: She is oriented to person, place, and time. She appears well-developed and well-nourished.  HENT:  Right Ear: External ear normal.  Left Ear: External ear normal.  Nose: Nose normal.  Mouth/Throat: Oropharynx is clear and moist.  Neck: Normal range of motion. Neck supple.  Cardiovascular:  Normal rate, regular rhythm and normal heart sounds.   Pulmonary/Chest: Effort normal and breath sounds normal.  Abdominal: Soft. Bowel sounds are normal.  Musculoskeletal: She exhibits edema and tenderness.  Neurological: She is alert and oriented to person, place, and time.  Skin: Skin is warm and dry.  Psychiatric: She has a normal mood and affect.          Assessment & Plan:   Problem List Items Addressed This Visit   None    Visit Diagnoses   Right ankle pain    -  Primary    Relevant Orders       DG Ankle Complete Right (Completed)    Fall down steps        Peripheral edema        Relevant Orders       Basic Metabolic Panel (Completed)       POC Urinalysis Dipstick (Completed)       Brain Natriuretic Peptide (Completed)      peripheral edema likely related to sodium retention. Increase Lasix to 60 mg daily. Continues seeing oncology. Labs sent. We'll follow up pending results. X-ray of the right ankle to rule out fracture. Ace wrap applied.

## 2013-08-24 NOTE — Patient Instructions (Signed)

## 2013-08-25 ENCOUNTER — Encounter: Payer: Self-pay | Admitting: Family

## 2013-08-25 ENCOUNTER — Telehealth: Payer: Self-pay | Admitting: Family

## 2013-08-25 NOTE — Telephone Encounter (Signed)
See results note. 

## 2013-08-25 NOTE — Telephone Encounter (Signed)
Pt would like foot xray results

## 2013-08-31 ENCOUNTER — Telehealth: Payer: Self-pay | Admitting: Family

## 2013-08-31 MED ORDER — POTASSIUM CHLORIDE ER 10 MEQ PO CPCR: 10.0000 meq | ORAL_CAPSULE | Freq: Two times a day (BID) | ORAL | Status: DC

## 2013-08-31 NOTE — Telephone Encounter (Signed)
Done

## 2013-08-31 NOTE — Telephone Encounter (Signed)
[facility] [place], Sue Ray is requesting a re-fill on potassium chloride (MICRO-K) 10 MEQ CR capsule

## 2013-09-04 ENCOUNTER — Encounter: Payer: Self-pay | Admitting: Gastroenterology

## 2013-09-21 ENCOUNTER — Telehealth: Payer: Self-pay | Admitting: Family

## 2013-09-21 MED ORDER — MIRTAZAPINE 7.5 MG PO TABS: ORAL_TABLET | ORAL | Status: DC

## 2013-09-21 MED ORDER — LISINOPRIL 10 MG PO TABS: ORAL_TABLET | ORAL | Status: DC

## 2013-09-21 MED ORDER — FUROSEMIDE 20 MG PO TABS: ORAL_TABLET | ORAL | Status: DC

## 2013-09-21 MED ORDER — LEVETIRACETAM 500 MG PO TABS: ORAL_TABLET | ORAL | Status: DC

## 2013-09-21 NOTE — Telephone Encounter (Signed)
Pt req rx on the following meds mirtazapine (REMERON) 7.5 MG tablet , lisinopril, lasix 20mg  keppra 500mg   Pharmacy  [facility] [address] phone number [phone]  Pt daughter said she need these called in today

## 2013-09-21 NOTE — Telephone Encounter (Signed)
Done. NO FURTHER REFILLS WITHOUT CPE

## 2013-10-07 ENCOUNTER — Emergency Department (HOSPITAL_COMMUNITY): Payer: Medicare Other

## 2013-10-07 ENCOUNTER — Encounter (HOSPITAL_COMMUNITY): Payer: Self-pay | Admitting: Emergency Medicine

## 2013-10-07 ENCOUNTER — Emergency Department (HOSPITAL_COMMUNITY)
Admission: EM | Admit: 2013-10-07 | Discharge: 2013-10-07 | Disposition: A | Discharge status: Home / Self Care | Payer: Medicare Other | Attending: Emergency Medicine | Admitting: Emergency Medicine

## 2013-10-07 DIAGNOSIS — Z8669 Personal history of other diseases of the nervous system and sense organs: Secondary | ICD-10-CM | POA: Diagnosis not present

## 2013-10-07 DIAGNOSIS — Z923 Personal history of irradiation: Secondary | ICD-10-CM | POA: Diagnosis not present

## 2013-10-07 DIAGNOSIS — R5383 Other fatigue: Secondary | ICD-10-CM

## 2013-10-07 DIAGNOSIS — R5381 Other malaise: Secondary | ICD-10-CM | POA: Diagnosis present

## 2013-10-07 DIAGNOSIS — Z79899 Other long term (current) drug therapy: Secondary | ICD-10-CM | POA: Insufficient documentation

## 2013-10-07 DIAGNOSIS — R82998 Other abnormal findings in urine: Secondary | ICD-10-CM

## 2013-10-07 DIAGNOSIS — R509 Fever, unspecified: Secondary | ICD-10-CM | POA: Diagnosis not present

## 2013-10-07 DIAGNOSIS — Z87898 Personal history of other specified conditions: Secondary | ICD-10-CM

## 2013-10-07 DIAGNOSIS — Z85528 Personal history of other malignant neoplasm of kidney: Secondary | ICD-10-CM | POA: Insufficient documentation

## 2013-10-07 DIAGNOSIS — Z792 Long term (current) use of antibiotics: Secondary | ICD-10-CM | POA: Diagnosis not present

## 2013-10-07 DIAGNOSIS — Z85841 Personal history of malignant neoplasm of brain: Secondary | ICD-10-CM | POA: Diagnosis not present

## 2013-10-07 DIAGNOSIS — Z87891 Personal history of nicotine dependence: Secondary | ICD-10-CM | POA: Diagnosis not present

## 2013-10-07 LAB — URINALYSIS, ROUTINE W REFLEX MICROSCOPIC
Bilirubin Urine: NEGATIVE
Glucose, UA: NEGATIVE mg/dL
HGB URINE DIPSTICK: NEGATIVE
Ketones, ur: 15 mg/dL — AB
Nitrite: NEGATIVE
Protein, ur: NEGATIVE mg/dL
SPECIFIC GRAVITY, URINE: 1.019 (ref 1.005–1.030)
Urobilinogen, UA: 0.2 mg/dL (ref 0.0–1.0)
pH: 5.5 (ref 5.0–8.0)

## 2013-10-07 LAB — CBC WITH DIFFERENTIAL/PLATELET
Basophils Absolute: 0 10*3/uL (ref 0.0–0.1)
Basophils Relative: 0 % (ref 0–1)
Eosinophils Absolute: 0.1 10*3/uL (ref 0.0–0.7)
Eosinophils Relative: 2 % (ref 0–5)
HCT: 30.6 % — ABNORMAL LOW (ref 36.0–46.0)
Hemoglobin: 9.6 g/dL — ABNORMAL LOW (ref 12.0–15.0)
LYMPHS PCT: 28 % (ref 12–46)
Lymphs Abs: 1.8 10*3/uL (ref 0.7–4.0)
MCH: 29.9 pg (ref 26.0–34.0)
MCHC: 31.4 g/dL (ref 30.0–36.0)
MCV: 95.3 fL (ref 78.0–100.0)
MONOS PCT: 12 % (ref 3–12)
Monocytes Absolute: 0.8 10*3/uL (ref 0.1–1.0)
NEUTROS ABS: 3.8 10*3/uL (ref 1.7–7.7)
NEUTROS PCT: 58 % (ref 43–77)
PLATELETS: 278 10*3/uL (ref 150–400)
RBC: 3.21 MIL/uL — ABNORMAL LOW (ref 3.87–5.11)
RDW: 16.4 % — ABNORMAL HIGH (ref 11.5–15.5)
WBC: 6.5 10*3/uL (ref 4.0–10.5)

## 2013-10-07 LAB — COMPREHENSIVE METABOLIC PANEL WITH GFR
ALT: 19 U/L (ref 0–35)
AST: 27 U/L (ref 0–37)
Albumin: 3.5 g/dL (ref 3.5–5.2)
Alkaline Phosphatase: 62 U/L (ref 39–117)
Anion gap: 17 — ABNORMAL HIGH (ref 5–15)
BUN: 16 mg/dL (ref 6–23)
CO2: 22 meq/L (ref 19–32)
Calcium: 9.1 mg/dL (ref 8.4–10.5)
Chloride: 99 meq/L (ref 96–112)
Creatinine, Ser: 0.71 mg/dL (ref 0.50–1.10)
GFR calc Af Amer: >90 mL/min (ref >90)
GFR calc non Af Amer: 83 mL/min — ABNORMAL LOW (ref >90)
Glucose, Bld: 105 mg/dL — ABNORMAL HIGH (ref 70–99)
Potassium: 4.3 meq/L (ref 3.7–5.3)
Sodium: 138 meq/L (ref 137–147)
Total Bilirubin: 0.3 mg/dL (ref 0.3–1.2)
Total Protein: 7.6 g/dL (ref 6.0–8.3)

## 2013-10-07 LAB — URINE MICROSCOPIC-ADD ON

## 2013-10-07 LAB — I-STAT CG4 LACTIC ACID, ED: Lactic Acid, Venous: 1.41 mmol/L (ref 0.5–2.2)

## 2013-10-07 MED ORDER — LEVOFLOXACIN 250 MG PO TABS: 250.0000 mg | ORAL_TABLET | Freq: Every day | ORAL | Status: DC

## 2013-10-07 MED ORDER — SODIUM CHLORIDE 0.9 % IV BOLUS (SEPSIS)
1000.0000 mL | Freq: Once | INTRAVENOUS | Status: AC
  Administered 2013-10-07: 1000 mL via INTRAVENOUS

## 2013-10-07 NOTE — ED Notes (Signed)
106/58 HR=95

## 2013-10-07 NOTE — ED Notes (Signed)
Pt in with family reporting fever, fatigue, body aches, decreased appetite, spoke with oncologist and they wanted her to come in for evaluation, pt has last chemo treatment on Tuesday.

## 2013-10-07 NOTE — Discharge Instructions (Signed)
Fever, Adult °A fever is a temperature of 100.4° F (38° C) or above.  °HOME CARE °· Take fever medicine as told by your doctor. Do not  take aspirin for fever if you are younger than 75 years of age. °· If you are given antibiotic medicine, take it as told. Finish the medicine even if you start to feel better. °· Rest. °· Drink enough fluids to keep your pee (urine) clear or pale yellow. Do not drink alcohol. °· Take a bath or shower with room temperature water. Do not use ice water or alcohol sponge baths. °· Wear lightweight, loose clothes. °GET HELP RIGHT AWAY IF:  °· You are short of breath or have trouble breathing. °· You are very weak. °· You are dizzy or you pass out (faint). °· You are very thirsty or are making little or no urine. °· You have new pain. °· You throw up (vomit) or have watery poop (diarrhea). °· You keep throwing up or having watery poop for more than 1 to 2 days. °· You have a stiff neck or light bothers your eyes. °· You have a skin rash. °· You have a fever or problems (symptoms) that last for more than 2 to 3 days. °· You have a fever and your problems quickly get worse. °· You keep throwing up the fluids you drink. °· You do not feel better after 3 days. °· You have new problems. °MAKE SURE YOU:  °· Understand these instructions. °· Will watch your condition. °· Will get help right away if you are not doing well or get worse. °Document Released: 12/10/2007 Document Revised: 05/25/2011 Document Reviewed: 01/01/2011 °ExitCare® Patient Information ©2015 [employer]. This information is not intended to replace advice given to you by your health care provider. Make sure you discuss any questions you have with your health care provider. ° °

## 2013-10-07 NOTE — ED Provider Notes (Signed)
CSN: [id number]     Arrival date & time 10/07/13  1743 History   First MD Initiated Contact with Patient 10/07/13 1902     Chief Complaint  Patient presents with  . Weakness  . Fever      HPI Patient presents with fever, fatigue, chills, decreased appetite.  Temperature is high as 100.4.  Family spoke with the oncologist who recommended she come to the emergency room for evaluation.  Her initial blood pressure at triage was 88 systolic however my initial blood pressure in the ED room was 606 systolic.  Patient does not appear toxic.  Patient denies abdominal pain, nausea, vomiting, diarrhea  Patient has history of renal cancer with metastasis.. Past Medical History  Diagnosis Date  . Macular degeneration   . Renal cell carcinoma   . Brain metastasis   . History of radiation therapy 07/15/2012    17 gray to right parietal, post-operative   Past Surgical History  Procedure Laterality Date  . 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 Brain cancer Neg Hx   . Hypertension Brother   . Hyperlipidemia Brother   . Parkinson's disease Brother   . Cancer Brother     Lung cancer  . Heart attack Brother    History  Substance Use Topics  . Smoking status: Former Smoker    Types: Cigarettes    Quit date: 01/15/2012  . Smokeless tobacco: Never Used  . Alcohol Use: Yes     Comment: Rare   OB History   Grav Para Term Preterm Abortions TAB SAB Ect Mult Living                 Review of Systems  All other systems reviewed and are negative  Allergies  Sulfa antibiotics and Sutent  Home Medications   Prior to Admission medications   Medication Sig Start Date End Date Taking? Authorizing Provider  beta carotene w/minerals (OCUVITE) tablet Take 1 tablet by  mouth daily.   Yes Historical Provider, MD  furosemide (LASIX) 20 MG tablet Take 60 mg by mouth daily.   Yes Historical Provider, MD  levETIRAcetam (KEPPRA) 500 MG tablet Take 500 mg by mouth 2 (two) times daily.   Yes Historical Provider, MD  lisinopril (PRINIVIL,ZESTRIL) 10 MG tablet Take 10 mg by mouth daily.   Yes Historical Provider, MD  mirtazapine (REMERON) 7.5 MG tablet Take 7.5 mg by mouth at bedtime.   Yes Historical Provider, MD  Polyethyl Glycol-Propyl Glycol (SYSTANE) 0.4-0.3 % SOLN Apply 1 drop to eye daily as needed (for dry eyes).    Yes Historical Provider, MD  potassium chloride (MICRO-K) 10 MEQ CR capsule Take 1 capsule (10 mEq total) by mouth 2 (two) times daily. 08/31/13  Yes Timoteo Gaul, FNP  levofloxacin (LEVAQUIN) 250 MG tablet Take 1 tablet (250 mg total) by mouth daily. 10/07/13   Antonietta Breach, PA-C   BP 101/60  Pulse 97  Temp(Src) 99.2 F (37.3 C) (Oral)  Resp 21  Ht 4\' 9"  (1.448 m)  Wt 130 lb (58.968 kg)  BMI 28.12 kg/m2  SpO2 94% Physical Exam Physical Exam  Nursing note and vitals reviewed. Constitutional: She is oriented to person, place, and time. She appears well-developed and well-nourished. No distress.  HENT:  Head: Normocephalic  and atraumatic.  Eyes: Pupils are equal, round, and reactive to light.  Neck: Normal range of motion.  Cardiovascular: Normal rate and intact distal pulses.   Pulmonary/Chest: No respiratory distress.  Abdominal: Normal appearance. She exhibits no distension.  Musculoskeletal: Normal range of motion.  Neurological: She is alert and oriented to person, place, and time. No cranial nerve deficit.  no focal or lateralizing neurologic findings of weakness. Skin: Skin is warm and dry. No rash noted.  Psychiatric: She has a normal mood and affect. Her behavior is normal.   ED Course  Procedures (including critical care time) Labs Review Labs Reviewed  CBC WITH DIFFERENTIAL - Abnormal; Notable for the following:    RBC  3.21 (*)    Hemoglobin 9.6 (*)    HCT 30.6 (*)    RDW 16.4 (*)    All other components within normal limits  COMPREHENSIVE METABOLIC PANEL - Abnormal; Notable for the following:    Glucose, Bld 105 (*)    GFR calc non Af Amer 83 (*)    Anion gap 17 (*)    All other components within normal limits  URINALYSIS, ROUTINE W REFLEX MICROSCOPIC - Abnormal; Notable for the following:    APPearance CLOUDY (*)    Ketones, ur 15 (*)    Leukocytes, UA LARGE (*)    All other components within normal limits  URINE MICROSCOPIC-ADD ON - Abnormal; Notable for the following:    Bacteria, UA FEW (*)    Casts HYALINE CASTS (*)    All other components within normal limits  CULTURE, BLOOD (ROUTINE X 2)  CULTURE, BLOOD (ROUTINE X 2)  URINE CULTURE  I-STAT CG4 LACTIC ACID, ED    Imaging Review Dg Chest 2 View  10/07/2013   CLINICAL DATA:  Fever and cough ; history of renal cell carcinoma  EXAM: CHEST  2 VIEW  COMPARISON:  September 06, 2012  FINDINGS: There is no edema or consolidation. The heart size and pulmonary vascularity are normal. No adenopathy. Anterior wedging of several mid thoracic vertebral bodies is a stable finding. No blastic or lytic bone lesions are identified. There is pectus excavatum.  IMPRESSION: No edema or consolidation.   Electronically Signed   By: Lowella Grip M.D.   On: 10/07/2013 20:17    Patient was turned over to Dr. Jeneen Rinks and Cla[place].  Apparently patient was discharged after discussion with her oncologist at [facility].  Patient was stable for discharge.  MDM   Final diagnoses:  Other fatigue  History of fever  Leukocytes in urine        Dot Lanes, MD 10/08/13 1119

## 2013-10-07 NOTE — ED Provider Notes (Signed)
2300 - Patient care assumed from Dr. Audie Pinto at shift change. Patient with hx of renal cell CA with mets to brain. Followed by Dr. Thera Flake at [facility] hematology/oncology. Patient today with no fever while in ED. Hypotensive in triage, but this was not the case on Dr. Antonietta Barcelona initial presentation or during stay in ED. Orthostatics completed and stable. Patient hydrated with IVF. No leukocytosis or leukopenia today. Patient is mildly anemic compared to 1 year ago; no more recent value for comparison. Liver and kidney function normal. Lactate WNL. UA today with large leukocytes; few bacteria and nitrites negative.  Have discussed case with Dr. Tia Masker, on call for Dr. Thera Flake. Dr. Tia Masker believes patient is stable for d/c with outpatient f/u. Also recommends Levaquin x 3 days given endorsed temp and leukocytes in urine. Plan discussed with patient who verbalizes comfort and understanding with plan. Return precautions discussed in provided. Patient with no unaddressed concerns and discharged in good condition.   Filed Vitals:   10/07/13 2120 10/07/13 2145 10/07/13 2214 10/07/13 2230  BP:  112/50 112/50 101/60  Pulse:  101 100 97  Temp: 99.2 F (37.3 C)     TempSrc: Oral     Resp:  21 28 21   Height:      Weight:      SpO2:  97% 96% 94%     Antonietta Breach, PA-C 10/07/13 2316

## 2013-10-07 NOTE — ED Notes (Signed)
109/68 HR=96

## 2013-10-07 NOTE — ED Notes (Signed)
104/57 HR=98/min

## 2013-10-08 ENCOUNTER — Telehealth (HOSPITAL_BASED_OUTPATIENT_CLINIC_OR_DEPARTMENT_OTHER): Payer: Self-pay

## 2013-10-08 NOTE — Telephone Encounter (Signed)
Chart reviewed by Margarita Mail PA " Have patient return to Hospital for (+) Blood Culture"  7/26 @ 2055 LVM requesting callback.  7/26 @ 2058 Pts daughter returned call.  Informed of (+) prelim blood culture and need to return.  Pts daughter doesn't want to return tonight stated pt has appt w/oncologist Dr Dorthula Matas 7/27 @ 4 pm.  Cx report faxed to Dr Vena Rua attn @ [phone].

## 2013-10-08 NOTE — Telephone Encounter (Signed)
Call from [facility] w/(+) prelim Bld cx aerobic bottle (1 of 4 bottles) Gram (+) cocci in clusters.  Dcd w/ Rx for Levofloxacin.  Chart to MD for review.

## 2013-10-09 LAB — URINE CULTURE

## 2013-10-10 LAB — CULTURE, BLOOD (ROUTINE X 2)

## 2013-10-14 LAB — CULTURE, BLOOD (ROUTINE X 2): Culture: NO GROWTH

## 2013-10-14 NOTE — ED Provider Notes (Signed)
Medical screening examination/treatment/procedure(s) were performed by non-physician practitioner and as supervising physician I was immediately available for consultation/collaboration.   EKG Interpretation None        Tanna Furry, MD 10/14/13 [phone]

## 2013-11-07 ENCOUNTER — Ambulatory Visit
Admission: RE | Admit: 2013-11-07 | Discharge: 2013-11-07 | Disposition: A | Discharge status: Home / Self Care | Payer: Medicare Other | Source: Ambulatory Visit | Attending: Radiation Oncology | Admitting: Radiation Oncology

## 2013-11-07 DIAGNOSIS — C7949 Secondary malignant neoplasm of other parts of nervous system: Principal | ICD-10-CM

## 2013-11-07 DIAGNOSIS — C7931 Secondary malignant neoplasm of brain: Secondary | ICD-10-CM

## 2013-11-07 MED ORDER — GADOBENATE DIMEGLUMINE 529 MG/ML IV SOLN
12.0000 mL | Freq: Once | INTRAVENOUS | Status: AC | PRN
  Administered 2013-11-07: 12 mL via INTRAVENOUS

## 2013-11-14 ENCOUNTER — Other Ambulatory Visit: Payer: Self-pay | Admitting: Family

## 2013-11-23 ENCOUNTER — Other Ambulatory Visit: Payer: Self-pay | Admitting: Family

## 2013-11-24 ENCOUNTER — Other Ambulatory Visit: Payer: Self-pay | Admitting: Family

## 2013-11-27 ENCOUNTER — Other Ambulatory Visit: Payer: Self-pay | Admitting: *Deleted

## 2013-11-27 DIAGNOSIS — C7931 Secondary malignant neoplasm of brain: Secondary | ICD-10-CM

## 2013-11-27 DIAGNOSIS — C7949 Secondary malignant neoplasm of other parts of nervous system: Principal | ICD-10-CM

## 2014-01-05 ENCOUNTER — Telehealth: Payer: Self-pay | Admitting: Family

## 2014-01-05 NOTE — Telephone Encounter (Signed)
Per Padonda, Pt needs OV. Hydrocodone cannot be escribed, faxed or phoned in. Must be picked up at office

## 2014-01-05 NOTE — Telephone Encounter (Signed)
Daughter is calling to see if her mom can have a refill on her hydrocodone that was last filled 12/05/2012. Please advise. Pharmacy is [facility] [address]

## 2014-01-09 MED ORDER — HYDROCODONE-ACETAMINOPHEN 5-325 MG PO TABS: 1.0000 | ORAL_TABLET | Freq: Four times a day (QID) | ORAL | Status: AC | PRN

## 2014-01-09 NOTE — Telephone Encounter (Signed)
Pt's daughter called back to check the status of re-fill request.  I advised her that the pt needs an OV but daughter states pt has cancer and isn't doing well, she would like to talk to you about the re-fill request.

## 2014-01-09 NOTE — Telephone Encounter (Signed)
Pt's daughter aware Rx is ready for pick up and she will pick it up 01/10/14

## 2014-02-15 ENCOUNTER — Other Ambulatory Visit: Payer: Medicare Other

## 2014-02-19 ENCOUNTER — Ambulatory Visit: Payer: Medicare Other | Admitting: Radiation Oncology

## 2014-03-01 ENCOUNTER — Emergency Department (HOSPITAL_COMMUNITY): Payer: Medicare Other

## 2014-03-01 ENCOUNTER — Emergency Department (HOSPITAL_COMMUNITY)
Admission: EM | Admit: 2014-03-01 | Discharge: 2014-03-02 | Disposition: A | Discharge status: Home / Self Care | Payer: Medicare Other | Attending: Emergency Medicine | Admitting: Emergency Medicine

## 2014-03-01 ENCOUNTER — Encounter (HOSPITAL_COMMUNITY): Payer: Self-pay | Admitting: Emergency Medicine

## 2014-03-01 DIAGNOSIS — M545 Low back pain, unspecified: Secondary | ICD-10-CM

## 2014-03-01 DIAGNOSIS — C649 Malignant neoplasm of unspecified kidney, except renal pelvis: Secondary | ICD-10-CM | POA: Diagnosis not present

## 2014-03-01 DIAGNOSIS — I10 Essential (primary) hypertension: Secondary | ICD-10-CM | POA: Insufficient documentation

## 2014-03-01 DIAGNOSIS — R109 Unspecified abdominal pain: Secondary | ICD-10-CM | POA: Diagnosis present

## 2014-03-01 DIAGNOSIS — Z8669 Personal history of other diseases of the nervous system and sense organs: Secondary | ICD-10-CM | POA: Diagnosis not present

## 2014-03-01 DIAGNOSIS — J45909 Unspecified asthma, uncomplicated: Secondary | ICD-10-CM | POA: Insufficient documentation

## 2014-03-01 DIAGNOSIS — Z79899 Other long term (current) drug therapy: Secondary | ICD-10-CM | POA: Insufficient documentation

## 2014-03-01 DIAGNOSIS — N390 Urinary tract infection, site not specified: Secondary | ICD-10-CM | POA: Insufficient documentation

## 2014-03-01 DIAGNOSIS — Z87891 Personal history of nicotine dependence: Secondary | ICD-10-CM | POA: Diagnosis not present

## 2014-03-01 DIAGNOSIS — I714 Abdominal aortic aneurysm, without rupture, unspecified: Secondary | ICD-10-CM

## 2014-03-01 DIAGNOSIS — Z85841 Personal history of malignant neoplasm of brain: Secondary | ICD-10-CM | POA: Diagnosis not present

## 2014-03-01 DIAGNOSIS — R112 Nausea with vomiting, unspecified: Secondary | ICD-10-CM

## 2014-03-01 DIAGNOSIS — R1030 Lower abdominal pain, unspecified: Secondary | ICD-10-CM

## 2014-03-01 HISTORY — DX: Essential (primary) hypertension: I10

## 2014-03-01 HISTORY — DX: Unspecified asthma, uncomplicated: J45.909

## 2014-03-01 LAB — COMPREHENSIVE METABOLIC PANEL
ALT: 18 U/L (ref 0–35)
AST: 22 U/L (ref 0–37)
Albumin: 3.6 g/dL (ref 3.5–5.2)
Alkaline Phosphatase: 78 U/L (ref 39–117)
Anion gap: 18 — ABNORMAL HIGH (ref 5–15)
BUN: 11 mg/dL (ref 6–23)
CALCIUM: 9.7 mg/dL (ref 8.4–10.5)
CO2: 23 meq/L (ref 19–32)
Chloride: 94 mEq/L — ABNORMAL LOW (ref 96–112)
Creatinine, Ser: 0.52 mg/dL (ref 0.50–1.10)
GLUCOSE: 138 mg/dL — AB (ref 70–99)
Potassium: 3.4 mEq/L — ABNORMAL LOW (ref 3.7–5.3)
SODIUM: 135 meq/L — AB (ref 137–147)
Total Bilirubin: 0.5 mg/dL (ref 0.3–1.2)
Total Protein: 8.3 g/dL (ref 6.0–8.3)

## 2014-03-01 LAB — CBC WITH DIFFERENTIAL/PLATELET
Basophils Absolute: 0 10*3/uL (ref 0.0–0.1)
Basophils Relative: 0 % (ref 0–1)
EOS PCT: 1 % (ref 0–5)
Eosinophils Absolute: 0.1 10*3/uL (ref 0.0–0.7)
HCT: 43.4 % (ref 36.0–46.0)
HEMOGLOBIN: 14.3 g/dL (ref 12.0–15.0)
LYMPHS ABS: 1.5 10*3/uL (ref 0.7–4.0)
Lymphocytes Relative: 17 % (ref 12–46)
MCH: 28.4 pg (ref 26.0–34.0)
MCHC: 32.9 g/dL (ref 30.0–36.0)
MCV: 86.1 fL (ref 78.0–100.0)
MONOS PCT: 7 % (ref 3–12)
Monocytes Absolute: 0.6 10*3/uL (ref 0.1–1.0)
Neutro Abs: 6.5 10*3/uL (ref 1.7–7.7)
Neutrophils Relative %: 75 % (ref 43–77)
PLATELETS: 238 10*3/uL (ref 150–400)
RBC: 5.04 MIL/uL (ref 3.87–5.11)
RDW: 15.8 % — ABNORMAL HIGH (ref 11.5–15.5)
WBC: 8.7 10*3/uL (ref 4.0–10.5)

## 2014-03-01 LAB — URINALYSIS, ROUTINE W REFLEX MICROSCOPIC
Bilirubin Urine: NEGATIVE
Glucose, UA: NEGATIVE mg/dL
Ketones, ur: NEGATIVE mg/dL
Nitrite: NEGATIVE
PH: 5 (ref 5.0–8.0)
PROTEIN: NEGATIVE mg/dL
Specific Gravity, Urine: 1.018 (ref 1.005–1.030)
Urobilinogen, UA: 0.2 mg/dL (ref 0.0–1.0)

## 2014-03-01 LAB — LIPASE, BLOOD: Lipase: 15 U/L (ref 11–59)

## 2014-03-01 LAB — URINE MICROSCOPIC-ADD ON

## 2014-03-01 LAB — I-STAT TROPONIN, ED: Troponin i, poc: 0.01 ng/mL (ref 0.00–0.08)

## 2014-03-01 MED ORDER — MORPHINE SULFATE 4 MG/ML IJ SOLN
4.0000 mg | Freq: Once | INTRAMUSCULAR | Status: AC
  Administered 2014-03-01: 4 mg via INTRAVENOUS
  Filled 2014-03-01: qty 1

## 2014-03-01 MED ORDER — ONDANSETRON HCL 4 MG/2ML IJ SOLN
4.0000 mg | Freq: Once | INTRAMUSCULAR | Status: AC
  Administered 2014-03-01: 4 mg via INTRAVENOUS
  Filled 2014-03-01: qty 2

## 2014-03-01 MED ORDER — IOHEXOL 300 MG/ML  SOLN
25.0000 mL | Freq: Once | INTRAMUSCULAR | Status: AC | PRN
  Administered 2014-03-01: 25 mL via ORAL

## 2014-03-01 MED ORDER — IOHEXOL 300 MG/ML  SOLN
100.0000 mL | Freq: Once | INTRAMUSCULAR | Status: AC | PRN
  Administered 2014-03-01: 100 mL via INTRAVENOUS

## 2014-03-01 MED ORDER — SODIUM CHLORIDE 0.9 % IV BOLUS (SEPSIS)
500.0000 mL | Freq: Once | INTRAVENOUS | Status: AC
  Administered 2014-03-01: 500 mL via INTRAVENOUS

## 2014-03-01 NOTE — ED Notes (Signed)
Cancer pt, sts she was hypertensive today and c/o back and abd pain, V/D, no fever, A/OX4, NAD

## 2014-03-01 NOTE — ED Provider Notes (Signed)
CSN: [id number]     Arrival date & time 03/01/14  1750 History   First MD Initiated Contact with Patient 03/01/14 1931     Chief Complaint  Patient presents with  . Back Pain  . Abdominal Pain     (Consider location/radiation/quality/duration/timing/severity/associated sxs/prior Treatment) HPI  Sue Ray is a 73 y.o. 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 Thera Flake at [place] hem/onc she called today and sent her here due to 2 months of right-sided back pain as well as 1 day history of right-sided abdominal tenderness. Is not worse with movement or eating. She denies any fevers. She does have nausea and one episode of emesis with food contents, no bladder bile. Patient notes no changes in her stool other than diarrhea from chemotherapy. She was seen in September and had a abdominal CT which revealed new liver metastases. She was restarted on chemotherapy at that time. No fevers, chills, illness. She does have generalized weakness after starting chemotherapy. Patient denies vaginal or urinary complaints. No fevers, chills, night sweats, weight loss. No loss of control of bladder or bowel. No numbness/tingling, weakness or saddle anesthesia.    Patient with hx of renal cell CA with mets to brain. Followed by Dr. Thera Flake at [facility] hematology/oncology.  Past Medical History  Diagnosis Date  . Macular degeneration   . Renal cell carcinoma   . Brain metastasis   . History of radiation therapy 07/15/2012    17 gray to right parietal, post-operative  . Asthma   . Hypertension    Past Surgical History  Procedure Laterality Date  . Craniotomy Right 06/07/2012    Procedure: CRANIOTOMY TUMOR EXCISION;  Surgeon: Kristeen Miss, MD;  Location: [place] [facility];  Service: Neurosurgery;  Laterality: Right;  Right Parietal Craniotomy for Tumor with Stealth   Family History  Problem Relation Age of Onset  . Brain cancer Neg Hx    . Hypertension Brother   . Hyperlipidemia Brother   . Parkinson's disease Brother   . Cancer Brother     Lung cancer  . Heart attack Brother    History  Substance Use Topics  . Smoking status: Former Smoker    Types: Cigarettes    Quit date: 01/15/2012  . Smokeless tobacco: Never Used  . Alcohol Use: Yes     Comment: Rare   OB History    No data available     Review of Systems  Constitutional: Negative for fever and chills.  HENT: Negative for congestion and rhinorrhea.   Eyes: Negative for visual disturbance.  Respiratory: Negative for cough and shortness of breath.   Cardiovascular: Negative for chest pain and palpitations.  Gastrointestinal: Positive for nausea, vomiting, abdominal pain and diarrhea.  Genitourinary: Negative for dysuria and hematuria.  Musculoskeletal: Negative for back pain and gait problem.  Skin: Negative for rash.  Neurological: Negative for weakness and headaches.      Allergies  Sulfa antibiotics and Sutent  Home Medications   Prior to Admission medications   Medication Sig Start Date End Date Taking? Authorizing Provider  Alum & Mag Hydroxide-Simeth (MAGIC MOUTHWASH) SOLN Take 15 mLs by mouth 4 (four) times daily as needed for mouth pain.   Yes Historical Provider, MD  axitinib (INLYTA) 1 MG tablet Take 2-3 mg by mouth 2 (two) times daily. 2 mg in the morning and 3 mg after supper   Yes Historical Provider, MD  furosemide (LASIX) 20  MG tablet Take 60 mg by mouth daily.   Yes Historical Provider, MD  HYDROcodone-acetaminophen (NORCO) 5-325 MG per tablet Take 1 tablet by mouth every 6 (six) hours as needed for moderate pain. 01/09/14  Yes Timoteo Gaul, FNP  levETIRAcetam (KEPPRA) 500 MG tablet Take 500 mg by mouth 2 (two) times daily.   Yes Historical Provider, MD  lisinopril (PRINIVIL,ZESTRIL) 10 MG tablet Take 10 mg by mouth daily.   Yes Historical Provider, MD  mirtazapine (REMERON) 7.5 MG tablet Take 7.5 mg by mouth at bedtime.    Yes Historical Provider, MD  potassium chloride (MICRO-K) 10 MEQ CR capsule Take 10 mEq by mouth 2 (two) times daily.   Yes Historical Provider, MD   BP 95/60 mmHg  Pulse 96  Temp(Src) 97.4 F (36.3 C) (Oral)  Resp 21  Ht 4\' 11"  (1.499 m)  Wt 121 lb (54.885 kg)  BMI 24.43 kg/m2  SpO2 95% Physical Exam  Constitutional: She appears well-developed and well-nourished. No distress.  HENT:  Head: Normocephalic and atraumatic.  Eyes: Conjunctivae are normal. Right eye exhibits no discharge. Left eye exhibits no discharge.  Cardiovascular: Normal rate, regular rhythm and normal heart sounds.   Pulmonary/Chest: Effort normal and breath sounds normal. No respiratory distress. She has no wheezes.  Abdominal: Soft. Bowel sounds are normal. She exhibits no distension.  Mild right lower quadrant abdominal tenderness without rebound, guarding, rigidity.   Musculoskeletal:  No midline back tenderness, step off or crepitus. Right sided lower and mid back tenderness.   Neurological: She is alert. Coordination normal.  Equal muscle tone. 5/5 strength in lower extremities. DTR equal and intact. Negative straight leg test. Normal gait.   Skin: Skin is warm and dry. She is not diaphoretic.  Nursing note and vitals reviewed.   ED Course  Procedures (including critical care time) Labs Review Labs Reviewed  CBC WITH DIFFERENTIAL - Abnormal; Notable for the following:    RDW 15.8 (*)    All other components within normal limits  COMPREHENSIVE METABOLIC PANEL - Abnormal; Notable for the following:    Sodium 135 (*)    Potassium 3.4 (*)    Chloride 94 (*)    Glucose, Bld 138 (*)    Anion gap 18 (*)    All other components within normal limits  URINALYSIS, ROUTINE W REFLEX MICROSCOPIC - Abnormal; Notable for the following:    APPearance CLOUDY (*)    Hgb urine dipstick MODERATE (*)    Leukocytes, UA LARGE (*)    All other components within normal limits  URINE MICROSCOPIC-ADD ON - Abnormal;  Notable for the following:    Bacteria, UA FEW (*)    Casts HYALINE CASTS (*)    All other components within normal limits  LIPASE, BLOOD  I-STAT TROPOININ, ED    Imaging Review Ct Abdomen Pelvis W Contrast  03/01/2014   CLINICAL DATA:  Known liver and renal cancer. Currently on chemotherapy, treated at [facility]. Right lower quadrant pain radiating to the left.  EXAM: CT ABDOMEN AND PELVIS WITH CONTRAST  TECHNIQUE: Multidetector CT imaging of the abdomen and pelvis was performed using the standard protocol following bolus administration of intravenous contrast.  CONTRAST:  135mL OMNIPAQUE IOHEXOL 300 MG/ML  SOLN  COMPARISON:  09/08/2012  FINDINGS: BODY WALL: Unremarkable.  LOWER CHEST: No pneumonia  ABDOMEN/PELVIS:  Liver: From the most recent available comparison, there is new liver masses at least 4 in number. These are demarcated on the imaging for follow-up purposes. Based  on the clinical history these are known from outside imaging. The largest is in segment seven, measuring 3 cm below the capsule. Inferior right liver lesions distort the capsule and could be painful.  Biliary: No evidence of biliary obstruction or stone.  Pancreas: Unremarkable.  Spleen: Unremarkable.  Adrenals: Decreased enhancing mass within the left adrenal gland, 18 mm now, previously 22 mm.  Kidneys and ureters: Decreased vascularity within and large, exophytic right renal cell carcinoma. The mass is also smaller, now maximally 7 cm in diameter, previously 9 cm. No hydronephrosis. No evidence of acute hemorrhage.  Bladder: Unremarkable.  Reproductive: Unremarkable.  Bowel: No obstruction. Negative appendix.  Retroperitoneum: No mass or adenopathy.  Peritoneum: No ascites or pneumoperitoneum.  Vascular: There is fusiform aneurysmal enlargement of the descending thoracic aorta above and at the aortic hiatus. Maximal diameter is currently 4.5 cm, previously 3.6 cm. Along the ventral in right aspect of the aorta there is  low-attenuation with irregular margins. The exact location of the thrombus is uncertain. Lack of displaced calcium argues against dissection flap/intramural hematoma, although there was no clear ulcerated plaque in this region on the previous study to explain the rapid increase in thrombus.  OSSEOUS: No acute abnormalities.  No definitive metastasis.  Critical Value/emergent results were called by telephone at the time of interpretation on 03/01/2014 at 10:42 pm to Dr. Al Corpus , who verbally acknowledged these results.  IMPRESSION: 1. Rapid enlargement of aortic aneurysm at the hiatus (now 4.5 cm) concerning for unstable aneurysm. There is also new thrombus along the ventral and right aspect of the aneurysm which could be intramural or luminal. New perianeurysmal fat haziness that could reflect inflammation or leakage. 2. Renal cell carcinoma with liver and adrenal metastasis. Restaging would require correlation with imaging from outside hospital.   Electronically Signed   By: Jorje Guild M.D.   On: 03/01/2014 22:43     EKG Interpretation None      MDM   Final diagnoses:  Renal cell carcinoma  Lower abdominal pain  Nausea and vomiting, vomiting of unspecified type  Right-sided low back pain without sciatica   Patient with history of renal cell carcinoma with brain and hepatic metastases followed at [facility] by Dr. Thera Flake. Patient presenting with 2 month history of right-sided low/flank back tenderness as well as one-day history of right-sided lower quadrant tenderness that radiates into left. Patient also with episode of nausea and vomiting as well as more chronic diarrhea with chemotherapy treatments. VSS. Right-sided abdominal tenderness without signs of peritonitis. Patient without leukocytosis. No anemia. Patient also with evidence of urinary tract infection. CT abdomen pelvis with evidence of enlarging aneurysm. Max diameter 4.5 cm compared to 3.6 cm one year ago. Patient also with  new thrombus. There is new fat haziness that could reflect inflammation or possible leakage. Consult to vascular surgery. Spoke with Dr. Scot Dock at 11 PM. He is to come to see the patient in consultation. Dr. Scot Dock recommends further evaluation by vascular surgeon in [facility] who is an expert in the field. Dr. Scot Dock recommend that patient stable for discharge home with close follow-up tomorrow with the surgeon which he has arranged. I agree with this plan and have discussed personally with the patient the risks and benefits including death of going home as opposed to being transferred to [facility] and she verbalized understanding. Family at bedside and they also agree with this plan and verbalized understanding. Patient wants to sleep in her own bed tonight as well  as eat at home. Patient hemodynamically stable at this time and stable for discharge.  Discussed all results and patient verbalizes understanding and agrees with plan.  This is a shared patient. This patient was discussed with the physician, Dr. Doy Mince who saw and evaluated the patient and agrees with the plan.      Pura Spice, PA-C 03/02/14 0122  Artis Delay, MD 03/05/14 [phone]

## 2014-03-02 DIAGNOSIS — I712 Thoracic aortic aneurysm, without rupture: Secondary | ICD-10-CM

## 2014-03-02 MED ORDER — OXYCODONE-ACETAMINOPHEN 5-325 MG PO TABS
2.0000 | ORAL_TABLET | Freq: Once | ORAL | Status: AC
  Administered 2014-03-02: 2 via ORAL
  Filled 2014-03-02: qty 2

## 2014-03-02 MED ORDER — CIPROFLOXACIN HCL 500 MG PO TABS
500.0000 mg | ORAL_TABLET | Freq: Once | ORAL | Status: AC
  Administered 2014-03-02: 500 mg via ORAL
  Filled 2014-03-02: qty 1

## 2014-03-02 MED ORDER — CIPROFLOXACIN HCL 500 MG PO TABS: 500.0000 mg | ORAL_TABLET | Freq: Two times a day (BID) | ORAL | Status: AC

## 2014-03-02 NOTE — Consult Note (Signed)
[facility] [place]  Patient name: Sue Ray MRN: [id number] DOB: September 01, 1940 Sex: female  REASON FOR CONSULT: type V thoracoabdominal aneurysm  HPI: Sue Ray is a 73 y.o. female with a history of metastatic stage IV renal cell cancer who presents to the emergency department with lower abdominal pain. She developed lower abdominal pain earlier today which had been persistent and severe enough that she presented to the emergency department. She received morphine in the emergency department and her pain has resolved. She did have some nausea and vomiting earlier today but this too has resolved. Currently she is hungry.  In addition, she describes at least 3 months of chronic back pain and right flank pain. This has been mild pain which is persistent and she felt most likely related to her renal cell carcinoma in the right kidney. Given her abdominal pain and back pain she underwent a CT scan of the abdomen and pelvis tonight which showed enlargement of a known aneurysm involving her thoracic aorta and extending down to the level between the celiac axis and the superior mesenteric artery. The CT scan did not cover the entire chest but the aneurysm appears beginning in the mid thoracic aorta and and between the levels of the celiac axis and superior mesenteric artery. The maximum diameter is 4.5 cm. In June 2014 it was 3.6 cm. There is significant laminated thrombus present now which was not present a year and a half ago. The radiologist also noted perianal fat haziness that could represent inflammation and they were concerned that the aneurysm could explain her symptoms.  The patient also notes that earlier today her blood pressure was elevated at 180/117. I am unable to determine if this was the result of the pain she was having or if the elevated blood pressure came before the pain. Currently her blood pressure is well controlled.  Past Medical History  Diagnosis  Date  . Macular degeneration   . Renal cell carcinoma   . Brain metastasis   . History of radiation therapy 07/15/2012    17 gray to right parietal, post-operative  . Asthma   . Hypertension     Family History  Problem Relation Age of Onset  . Brain cancer Neg Hx   . Hypertension Brother   . Hyperlipidemia Brother   . Parkinson's disease Brother   . Cancer Brother     Lung cancer  . Heart attack Brother     SOCIAL HISTORY: History  Substance Use Topics  . Smoking status: Former Smoker    Types: Cigarettes    Quit date: 01/15/2012  . Smokeless tobacco: Never Used  . Alcohol Use: Yes     Comment: Rare    Allergies  Allergen Reactions  . Sulfa Antibiotics Swelling    nervous  . Sutent [Sunitinib] Shortness Of Breath    No current facility-administered medications for this encounter.   Current Outpatient Prescriptions  Medication Sig Dispense Refill  . Alum & Mag Hydroxide-Simeth (MAGIC MOUTHWASH) SOLN Take 15 mLs by mouth 4 (four) times daily as needed for mouth pain.    Marland Kitchen axitinib (INLYTA) 1 MG tablet Take 2-3 mg by mouth 2 (two) times daily. 2 mg in the morning and 3 mg after supper    . furosemide (LASIX) 20 MG tablet Take 60 mg by mouth daily.    Marland Kitchen HYDROcodone-acetaminophen (NORCO) 5-325 MG per tablet Take 1 tablet by mouth every 6 (six) hours as needed for moderate pain. [place]  tablet 0  . levETIRAcetam (KEPPRA) 500 MG tablet Take 500 mg by mouth 2 (two) times daily.    Marland Kitchen lisinopril (PRINIVIL,ZESTRIL) 10 MG tablet Take 10 mg by mouth daily.    . mirtazapine (REMERON) 7.5 MG tablet Take 7.5 mg by mouth at bedtime.    . potassium chloride (MICRO-K) 10 MEQ CR capsule Take 10 mEq by mouth 2 (two) times daily.      REVIEW OF SYSTEMS: Valu.Nieves ] denotes positive finding; [  ] denotes negative finding CARDIOVASCULAR:  [ ]  chest pain   [ ]  chest pressure   [ ]  palpitations   [ ]  orthopnea   Valu.Nieves ] dyspnea on exertion   [ ]  claudication   [ ]  rest pain   [ ]  DVT   [ ]   phlebitis PULMONARY:   [ ]  productive cough   [ ]  asthma   [ ]  wheezing Valu.Nieves ] history of bronchitis NEUROLOGIC:   [ ]  weakness  [ ]  paresthesias  [ ]  aphasia  [ ]  amaurosis  Valu.Nieves ] dizziness at Thanksgiving HEMATOLOGIC:   [ ]  bleeding problems   [ ]  clotting disorders MUSCULOSKELETAL:  [ ]  joint pain   [ ]  joint swelling [ ]  leg swelling GASTROINTESTINAL: [ ]   blood in stool  [ ]   hematemesis GENITOURINARY:  [ ]   dysuria  [ ]   hematuria PSYCHIATRIC:  [ ]  history of major depression INTEGUMENTARY:  [ ]  rashes  [ ]  ulcers CONSTITUTIONAL:  [ ]  fever   [ ]  chills  PHYSICAL EXAM: Filed Vitals:   03/01/14 2245 03/01/14 2300 03/01/14 2315 03/01/14 2330  BP: 98/58 95/60 95/57  99/70  Pulse: 91 96 89 89  Temp:      TempSrc:      Resp: 22 21 23 25   Height:      Weight:      SpO2: 94% 95% 93% 94%   Body mass index is 24.43 kg/(m^2). GENERAL: The patient is a well-nourished female, in no acute distress. The vital signs are documented above. CARDIOVASCULAR: There is a regular rate and rhythm. I do not detect carotid bruits. She has palpable femoral pulses and palpable pedal pulses bilaterally. PULMONARY: There is good air exchange bilaterally without wheezing or rales. ABDOMEN: Soft and non-tender with normal pitched bowel sounds. Her aorta is nontender. MUSCULOSKELETAL: There are no major deformities or cyanosis. NEUROLOGIC: No focal weakness or paresthesias are detected. SKIN: There are no ulcers or rashes noted. PSYCHIATRIC: The patient has a normal affect.  DATA:  Lab Results  Component Value Date   WBC 8.7 03/01/2014   HGB 14.3 03/01/2014   HCT 43.4 03/01/2014   MCV 86.1 03/01/2014   PLT 238 03/01/2014   Lab Results  Component Value Date   NA 135* 03/01/2014   K 3.4* 03/01/2014   CL 94* 03/01/2014   CO2 23 03/01/2014   Lab Results  Component Value Date   CREATININE 0.52 03/01/2014    CT ABDOMEN PELVIS: This shows enlargement of the type 5 thoracoabdominal aneurysm from 3.6  cm a year and a half ago to 4.5 cm now. There is laminated thrombus present within the aneurysm which was not present before and also perhaps some inflammation around the aneurysm in the distal thoracic aorta. The renal cell cancer in the right kidney and liver and adrenal metastases are also noted. The patient has reportedly had other CAT scans done elsewhere that are not available to [place] currently.  MEDICAL ISSUES:  TYPE V  THORACOABDOMINAL ANEURYSM: It is difficult to determine if her symptoms are related to her aneurysm. Her abdominal pain is in the lower abdomen and has resolved. Her back pain and right flank pain have been chronic for several months. Regardless, I have reviewed the CT scan with radiology and they feel that there is evidence of perhaps adjacent to the aneurysm which could potentially suggest that the aneurysm was symptomatic. However there is no evidence of obvious rupture or leak. Given that the aneurysm extends distally to the level between the celiac axis and superior mesenteric artery I do not think she is a candidate for TEVAR. If the aneurysm were to be repaired she would require either a thoracoabdominal approach which would be associated with significant risk area she could potentially be a candidate for a fenestrated graft. I've explained to the family that Dr. Sammuel Hines in [place] is a Emergency planning/management officer on fenestrated graft and also has extensive experience with thoracoabdominal repairs and for this reason I have recommended that she be transferred there for evaluation.  The patient has decided that she does not want to be transferred to [facility]. She understands that there is some risk of not being transferred tonight, including the risk of rupture of her aneurysm. She understands that risk.  I have spoken to Dr. Alecia Lemming at [facility] and they will call her tomorrow to arrange a clinic appointment.  [place] [facility]  [place]: [phone]

## 2014-03-02 NOTE — ED Provider Notes (Signed)
Medical screening examination/treatment/procedure(s) were conducted as a shared visit with non-physician practitioner(s) and myself.  I personally evaluated the patient during the encounter.   EKG Interpretation None      73 year old female who presents with acute onset lower abdominal pain. History of renal carcinoma which has metastasized.  Pain began this afternoon at approximately 3:00 and traveled across her lower abdomen. Eventually, it radiated to her back. The time of my interview, pain was resolved.  On exam, well appearing, nontoxic, not distressed, normal respiratory effort, normal perfusion, abdomen soft and nontender.  CT showed an enlarging aortic aneurysm with no thrombus.  Dr. Doren Custard (vascular surgery) consulted.  Ultimately, Dr. Doren Custard recommended that patient be evaluated by a Vascular Surgeon at [facility].  Pt did not want to be transferred to [facility] and understood the risks of leaving without complete evaluation (including death).  Of note, her blood pressures were in the 90's and 100's, which appears consistent with her last ED visit (from which she was discharged.)  This is likely her baseline.  She has no symptoms currently, including abdominal pain.  Dr. Doren Custard is working to arrange her follow up for tomorrow.    Clinical Impression: 1. AAA (abdominal aortic aneurysm)   2. Renal cell carcinoma   3. Lower abdominal pain   4. Nausea and vomiting, vomiting of unspecified type   5. Right-sided low back pain without sciatica   6. UTI (lower urinary tract infection)       Artis Delay, MD 03/02/14 [phone]

## 2014-03-02 NOTE — Discharge Instructions (Signed)
Return to the emergency room with worsening of symptoms, new symptoms or with symptoms that are concerning, especially lightheadedness, severe abdominal pain, nausea, vomiting, severe back pain, low blood pressure, confusion. Follow-up with vascular surgery [facility] tomorrow as scheduled. Please take all of your antibiotics until finished!   You may develop abdominal discomfort or diarrhea from the antibiotic.  You may help offset this with probiotics which you can buy or get in yogurt. Do not eat  or take the probiotics until 2 hours after your antibiotic.    Abdominal Aortic Aneurysm An aneurysm is a weakened or damaged part of an artery wall that bulges from the normal force of blood pumping through the body. An abdominal aortic aneurysm is an aneurysm that occurs in the lower part of the aorta, the main artery of the body.  The major concern with an abdominal aortic aneurysm is that it can enlarge and burst (rupture) or blood can flow between the layers of the wall of the aorta through a tear (aorticdissection). Both of these conditions can cause bleeding inside the body and can be life threatening unless diagnosed and treated promptly. CAUSES  The exact cause of an abdominal aortic aneurysm is unknown. Some contributing factors are:   A hardening of the arteries caused by the buildup of fat and other substances in the lining of a blood vessel (arteriosclerosis).  Inflammation of the walls of an artery (arteritis).   Connective tissue diseases, such as Marfan syndrome.   Abdominal trauma.   An infection, such as syphilis or staphylococcus, in the wall of the aorta (infectious aortitis) caused by bacteria. RISK FACTORS  Risk factors that contribute to an abdominal aortic aneurysm may include:  Age older than 45 years.   High blood pressure (hypertension).  Female gender.  Ethnicity (white race).  Obesity.  Family history of aneurysm (first degree relatives only).  Tobacco  use. PREVENTION  The following healthy lifestyle habits may help decrease your risk of abdominal aortic aneurysm:  Quitting smoking. Smoking can raise your blood pressure and cause arteriosclerosis.  Limiting or avoiding alcohol.  Keeping your blood pressure, blood sugar level, and cholesterol levels within normal limits.  Decreasing your salt intake. In somepeople, too much salt can raise blood pressure and increase your risk of abdominal aortic aneurysm.  Eating a diet low in saturated fats and cholesterol.  Increasing your fiber intake by including whole grains, vegetables, and fruits in your diet. Eating these foods may help lower blood pressure.  Maintaining a healthy weight.  Staying physically active and exercising regularly. SYMPTOMS  The symptoms of abdominal aortic aneurysm may vary depending on the size and rate of growth of the aneurysm.Most grow slowly and do not have any symptoms. When symptoms do occur, they may include:  Pain (abdomen, side, lower back, or groin). The pain may vary in intensity. A sudden onset of severe pain may indicate that the aneurysm has ruptured.  Feeling full after eating only small amounts of food.  Nausea or vomiting or both.  Feeling a pulsating lump in the abdomen.  Feeling faint or passing out. DIAGNOSIS  Since most unruptured abdominal aortic aneurysms have no symptoms, they are often discovered during diagnostic exams for other conditions. An aneurysm may be found during the following procedures:  Ultrasonography (A one-time screening for abdominal aortic aneurysm by ultrasonography is also recommended for all men aged 98-75 years who have ever smoked).  X-ray exams.  A computed tomography (CT).  Magnetic resonance imaging (MRI).  Angiography or arteriography. TREATMENT  Treatment of an abdominal aortic aneurysm depends on the size of your aneurysm, your age, and risk factors for rupture. Medication to control blood  pressure and pain may be used to manage aneurysms smaller than 6 cm. Regular monitoring for enlargement may be recommended by your caregiver if:  The aneurysm is 3-4 cm in size (an annual ultrasonography may be recommended).  The aneurysm is 4-4.5 cm in size (an ultrasonography every 6 months may be recommended).  The aneurysm is larger than 4.5 cm in size (your caregiver may ask that you be examined by a vascular surgeon). If your aneurysm is larger than 6 cm, surgical repair may be recommended. There are two main methods for repair of an aneurysm:   Endovascular repair (a minimally invasive surgery). This is done most often.  Open repair. This method is used if an endovascular repair is not possible. Document Released: 12/10/2004 Document Revised: 06/27/2012 Document Reviewed: 04/01/2012 [facility] Patient Information 2015 [place], [place]. This information is not intended to replace advice given to you by your health care provider. Make sure you discuss any questions you have with your health care provider.

## 2014-03-03 LAB — URINE CULTURE
COLONY COUNT: NO GROWTH
CULTURE: NO GROWTH

## 2014-03-19 DIAGNOSIS — I7410 Embolism and thrombosis of unspecified parts of aorta: Secondary | ICD-10-CM | POA: Diagnosis not present

## 2014-03-19 DIAGNOSIS — C7801 Secondary malignant neoplasm of right lung: Secondary | ICD-10-CM | POA: Diagnosis not present

## 2014-03-19 DIAGNOSIS — R918 Other nonspecific abnormal finding of lung field: Secondary | ICD-10-CM | POA: Diagnosis not present

## 2014-03-19 DIAGNOSIS — R0602 Shortness of breath: Secondary | ICD-10-CM | POA: Diagnosis not present

## 2014-03-19 DIAGNOSIS — Z09 Encounter for follow-up examination after completed treatment for conditions other than malignant neoplasm: Secondary | ICD-10-CM | POA: Diagnosis not present

## 2014-03-19 DIAGNOSIS — C787 Secondary malignant neoplasm of liver and intrahepatic bile duct: Secondary | ICD-10-CM | POA: Diagnosis not present

## 2014-03-19 DIAGNOSIS — Z9889 Other specified postprocedural states: Secondary | ICD-10-CM | POA: Diagnosis not present

## 2014-03-19 DIAGNOSIS — R634 Abnormal weight loss: Secondary | ICD-10-CM | POA: Diagnosis not present

## 2014-03-19 DIAGNOSIS — R52 Pain, unspecified: Secondary | ICD-10-CM | POA: Diagnosis not present

## 2014-03-19 DIAGNOSIS — I714 Abdominal aortic aneurysm, without rupture: Secondary | ICD-10-CM | POA: Diagnosis not present

## 2014-03-19 DIAGNOSIS — R5383 Other fatigue: Secondary | ICD-10-CM | POA: Diagnosis not present

## 2014-03-19 DIAGNOSIS — I2510 Atherosclerotic heart disease of native coronary artery without angina pectoris: Secondary | ICD-10-CM | POA: Diagnosis not present

## 2014-03-19 DIAGNOSIS — C7972 Secondary malignant neoplasm of left adrenal gland: Secondary | ICD-10-CM | POA: Diagnosis not present

## 2014-03-19 DIAGNOSIS — C7931 Secondary malignant neoplasm of brain: Secondary | ICD-10-CM | POA: Diagnosis not present

## 2014-03-19 DIAGNOSIS — C649 Malignant neoplasm of unspecified kidney, except renal pelvis: Secondary | ICD-10-CM | POA: Diagnosis not present

## 2014-03-20 ENCOUNTER — Encounter: Payer: Self-pay | Admitting: Radiation Therapy

## 2014-03-20 NOTE — Progress Notes (Signed)
Sue Ray's daughter called today to cancel Sue Ray's MRI and follow-up with Dr. Lisbeth Renshaw. She said that the patient's medical oncology care was transferred to Dr. Almedia Balls at [facility] when Dr. Humphrey Rolls left. The patient has requested that all of her follow-up care be done through Dr. Council Mechanic rather than having her Brain MRIs here and seeing Dr. Lisbeth Renshaw for Rad Onc or Dr. Ellene Route for neurosurgery here in [place]. Per her request, I have cancelled her MRI and visit with Dr. Lisbeth Renshaw scheduled later this month, and have let Dr. Lisbeth Renshaw know of this patient's request.   Mont Dutton R.T. (R) (T) [facility] [place] [phone] Office [phone] Pager [phone] Fax [email or web address]

## 2014-03-23 ENCOUNTER — Other Ambulatory Visit: Payer: Medicare Other

## 2014-03-26 ENCOUNTER — Ambulatory Visit: Payer: Medicare Other | Admitting: Radiation Oncology

## 2014-03-29 DIAGNOSIS — C649 Malignant neoplasm of unspecified kidney, except renal pelvis: Secondary | ICD-10-CM | POA: Diagnosis not present

## 2014-03-29 DIAGNOSIS — Z0389 Encounter for observation for other suspected diseases and conditions ruled out: Secondary | ICD-10-CM | POA: Diagnosis not present

## 2014-03-30 ENCOUNTER — Other Ambulatory Visit: Payer: Medicare Other

## 2014-04-02 ENCOUNTER — Ambulatory Visit: Payer: Self-pay | Admitting: Radiation Oncology

## 2014-04-23 DIAGNOSIS — I7410 Embolism and thrombosis of unspecified parts of aorta: Secondary | ICD-10-CM | POA: Diagnosis not present

## 2014-04-23 DIAGNOSIS — C641 Malignant neoplasm of right kidney, except renal pelvis: Secondary | ICD-10-CM | POA: Diagnosis not present

## 2014-04-23 DIAGNOSIS — E039 Hypothyroidism, unspecified: Secondary | ICD-10-CM | POA: Diagnosis not present

## 2014-04-23 DIAGNOSIS — E032 Hypothyroidism due to medicaments and other exogenous substances: Secondary | ICD-10-CM | POA: Diagnosis not present

## 2014-04-23 DIAGNOSIS — C649 Malignant neoplasm of unspecified kidney, except renal pelvis: Secondary | ICD-10-CM | POA: Diagnosis not present

## 2014-05-26 DIAGNOSIS — C7970 Secondary malignant neoplasm of unspecified adrenal gland: Secondary | ICD-10-CM | POA: Diagnosis not present

## 2014-05-26 DIAGNOSIS — Z515 Encounter for palliative care: Secondary | ICD-10-CM | POA: Diagnosis not present

## 2014-05-26 DIAGNOSIS — C7931 Secondary malignant neoplasm of brain: Secondary | ICD-10-CM | POA: Diagnosis not present

## 2014-05-26 DIAGNOSIS — E039 Hypothyroidism, unspecified: Secondary | ICD-10-CM | POA: Diagnosis not present

## 2014-05-26 DIAGNOSIS — R627 Adult failure to thrive: Secondary | ICD-10-CM | POA: Diagnosis not present

## 2014-05-26 DIAGNOSIS — R4182 Altered mental status, unspecified: Secondary | ICD-10-CM | POA: Diagnosis not present

## 2014-05-26 DIAGNOSIS — N2889 Other specified disorders of kidney and ureter: Secondary | ICD-10-CM | POA: Diagnosis not present

## 2014-05-26 DIAGNOSIS — R34 Anuria and oliguria: Secondary | ICD-10-CM | POA: Diagnosis not present

## 2014-05-26 DIAGNOSIS — R9431 Abnormal electrocardiogram [ECG] [EKG]: Secondary | ICD-10-CM | POA: Diagnosis not present

## 2014-05-26 DIAGNOSIS — E860 Dehydration: Secondary | ICD-10-CM | POA: Diagnosis not present

## 2014-05-26 DIAGNOSIS — T45515A Adverse effect of anticoagulants, initial encounter: Secondary | ICD-10-CM | POA: Diagnosis not present

## 2014-05-26 DIAGNOSIS — Z0389 Encounter for observation for other suspected diseases and conditions ruled out: Secondary | ICD-10-CM | POA: Diagnosis not present

## 2014-05-26 DIAGNOSIS — J90 Pleural effusion, not elsewhere classified: Secondary | ICD-10-CM | POA: Diagnosis not present

## 2014-05-26 DIAGNOSIS — I714 Abdominal aortic aneurysm, without rupture: Secondary | ICD-10-CM | POA: Diagnosis not present

## 2014-05-26 DIAGNOSIS — R791 Abnormal coagulation profile: Secondary | ICD-10-CM | POA: Diagnosis not present

## 2014-05-26 DIAGNOSIS — R911 Solitary pulmonary nodule: Secondary | ICD-10-CM | POA: Diagnosis not present

## 2014-05-26 DIAGNOSIS — E872 Acidosis: Secondary | ICD-10-CM | POA: Diagnosis not present

## 2014-05-26 DIAGNOSIS — I7410 Embolism and thrombosis of unspecified parts of aorta: Secondary | ICD-10-CM | POA: Diagnosis not present

## 2014-05-26 DIAGNOSIS — E861 Hypovolemia: Secondary | ICD-10-CM | POA: Diagnosis not present

## 2014-05-26 DIAGNOSIS — C7901 Secondary malignant neoplasm of right kidney and renal pelvis: Secondary | ICD-10-CM | POA: Diagnosis not present

## 2014-05-26 DIAGNOSIS — R579 Shock, unspecified: Secondary | ICD-10-CM | POA: Diagnosis not present

## 2014-05-26 DIAGNOSIS — I9589 Other hypotension: Secondary | ICD-10-CM | POA: Diagnosis not present

## 2014-05-26 DIAGNOSIS — C641 Malignant neoplasm of right kidney, except renal pelvis: Secondary | ICD-10-CM | POA: Diagnosis not present

## 2014-05-26 DIAGNOSIS — I240 Acute coronary thrombosis not resulting in myocardial infarction: Secondary | ICD-10-CM | POA: Diagnosis not present

## 2014-05-26 DIAGNOSIS — B348 Other viral infections of unspecified site: Secondary | ICD-10-CM | POA: Diagnosis not present

## 2014-05-26 DIAGNOSIS — R410 Disorientation, unspecified: Secondary | ICD-10-CM | POA: Diagnosis not present

## 2014-05-26 DIAGNOSIS — C649 Malignant neoplasm of unspecified kidney, except renal pelvis: Secondary | ICD-10-CM | POA: Diagnosis not present

## 2014-05-26 DIAGNOSIS — K7689 Other specified diseases of liver: Secondary | ICD-10-CM | POA: Diagnosis not present

## 2014-05-26 DIAGNOSIS — E46 Unspecified protein-calorie malnutrition: Secondary | ICD-10-CM | POA: Diagnosis not present

## 2014-05-26 DIAGNOSIS — I959 Hypotension, unspecified: Secondary | ICD-10-CM | POA: Diagnosis not present

## 2014-05-26 DIAGNOSIS — Z66 Do not resuscitate: Secondary | ICD-10-CM | POA: Diagnosis not present

## 2014-05-26 DIAGNOSIS — Z87891 Personal history of nicotine dependence: Secondary | ICD-10-CM | POA: Diagnosis not present

## 2014-05-26 DIAGNOSIS — I10 Essential (primary) hypertension: Secondary | ICD-10-CM | POA: Diagnosis not present

## 2014-05-26 DIAGNOSIS — C7800 Secondary malignant neoplasm of unspecified lung: Secondary | ICD-10-CM | POA: Diagnosis not present

## 2014-05-26 DIAGNOSIS — E2749 Other adrenocortical insufficiency: Secondary | ICD-10-CM | POA: Diagnosis not present

## 2014-05-26 DIAGNOSIS — E279 Disorder of adrenal gland, unspecified: Secondary | ICD-10-CM | POA: Diagnosis not present

## 2014-05-26 DIAGNOSIS — B341 Enterovirus infection, unspecified: Secondary | ICD-10-CM | POA: Diagnosis not present

## 2014-06-11 DIAGNOSIS — C641 Malignant neoplasm of right kidney, except renal pelvis: Secondary | ICD-10-CM | POA: Diagnosis not present

## 2014-06-17 ENCOUNTER — Encounter: Payer: Self-pay | Admitting: Gastroenterology

## 2014-07-15 DEATH — deceased

## 2015-03-17 IMAGING — CT CT ABD-PELV W/ CM
2 of 5 series · 16 of 46 positions shown, 18 images · IV contrast (OMNIPAQUE)
Comparison: 06/06/2012

CT CHEST

CLINICAL DATA: Renal cell carcinoma. Pulmonary, brain, and left
adrenal metastases.

CT CHEST, ABDOMEN AND PELVIS WITH CONTRAST
TECHNIQUE: Multidetector CT imaging of the chest, abdomen and
pelvis was performed following the standard protocol during bolus
administration of intravenous contrast.
Contrast:  100 ml 9mnipaque-LSS

[Series 2: cap with st · axial · 0.73mm/px · z∈[-364,+112]mm · 13 of 109 slices shown, 15 images]
[im 7/109  soft-tissue]
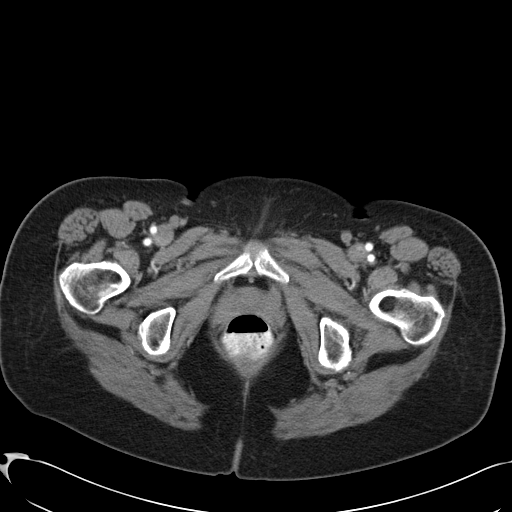
[im 7/109  bone]
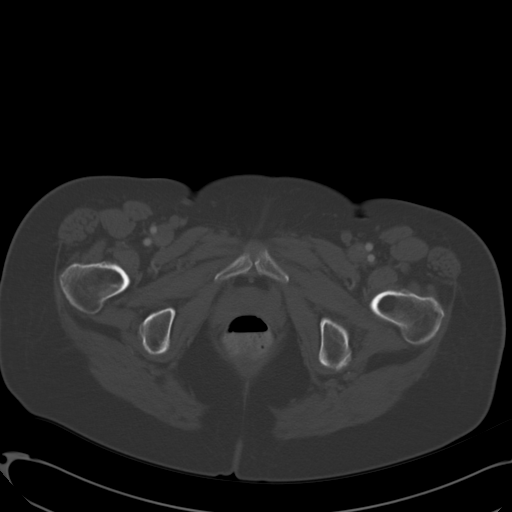
[im 13/109  soft-tissue]
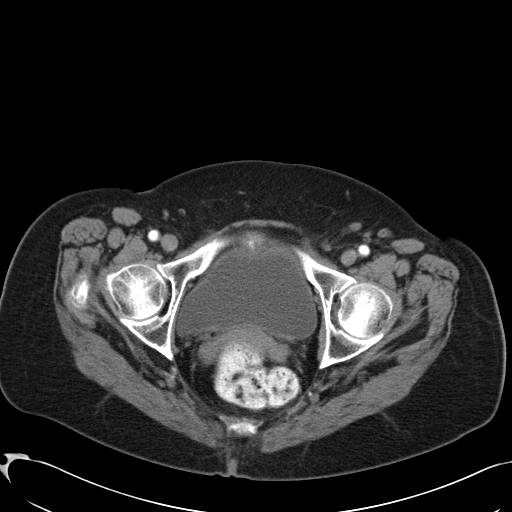
[im 26/109  soft-tissue]
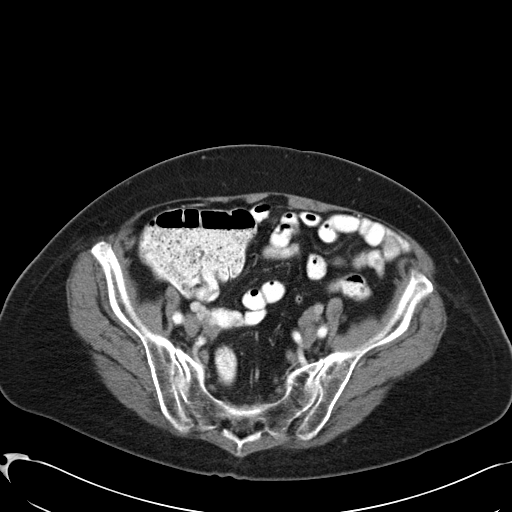
[im 32/109  soft-tissue]
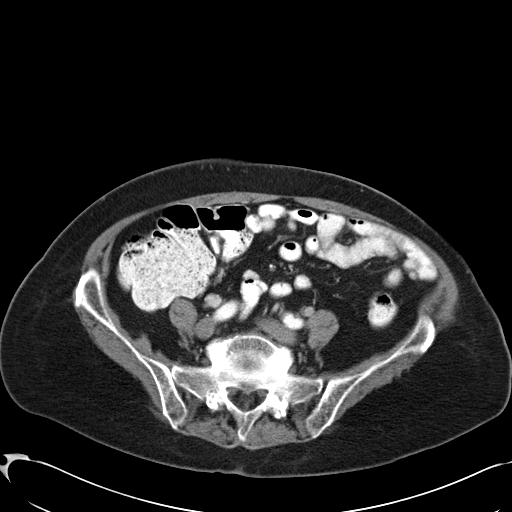
[im 39/109  soft-tissue]
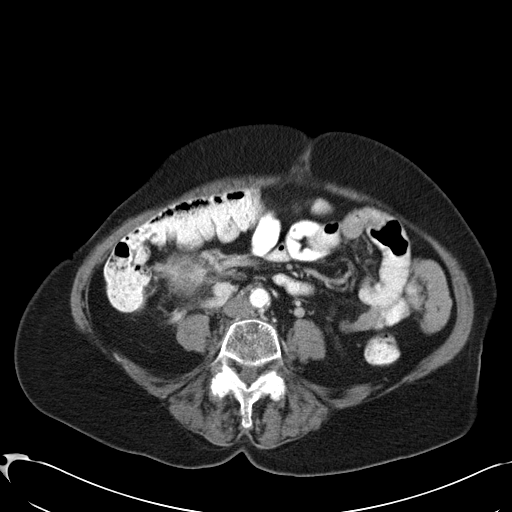
[im 45/109  soft-tissue]
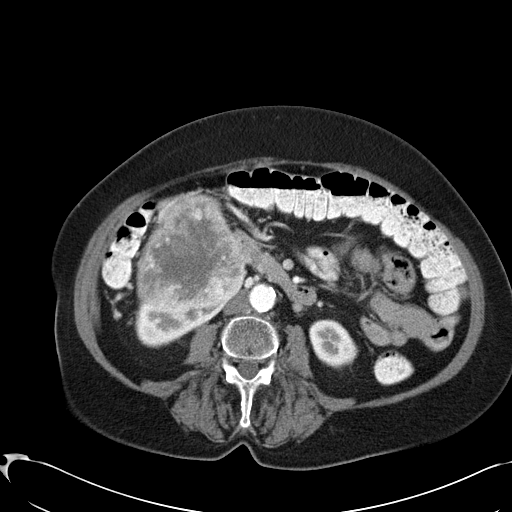
[im 58/109  soft-tissue]
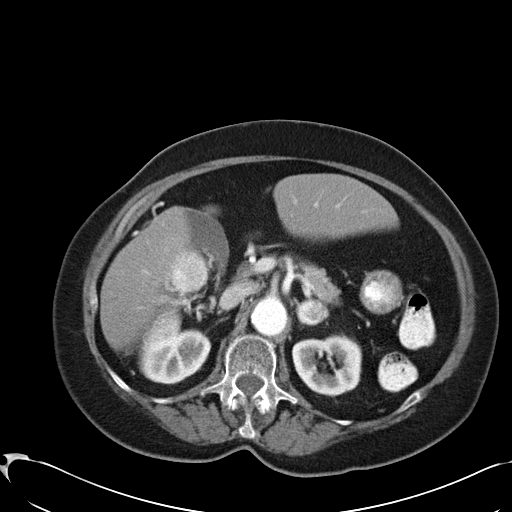
[im 64/109  soft-tissue]
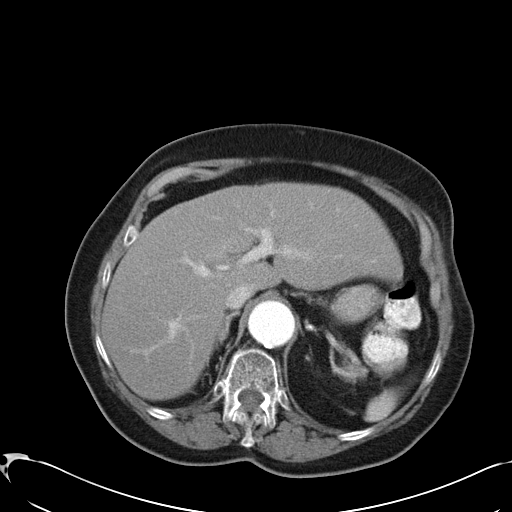
[im 70/109  soft-tissue]
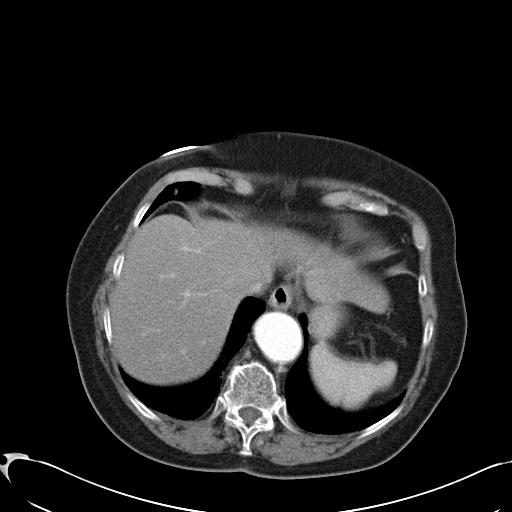
[im 70/109  bone]
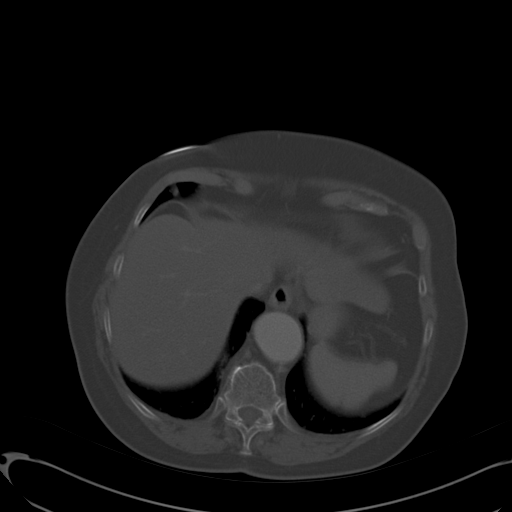
[im 77/109  soft-tissue]
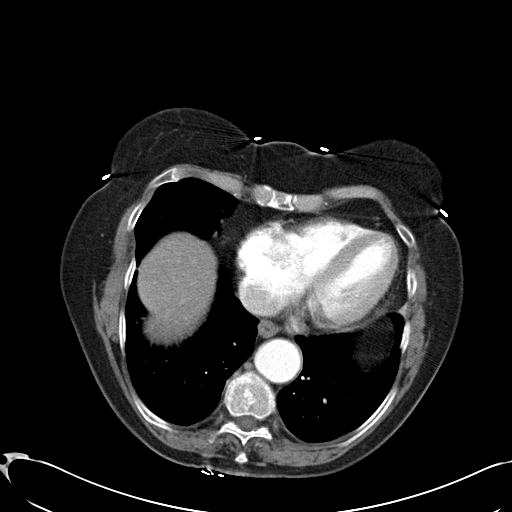
[im 83/109  soft-tissue]
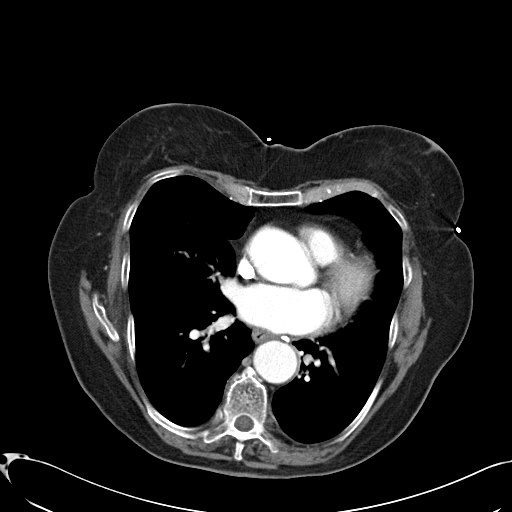
[im 96/109  soft-tissue]
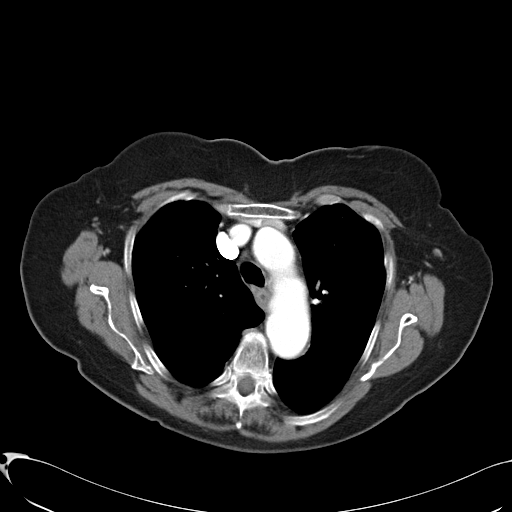
[im 102/109  soft-tissue]
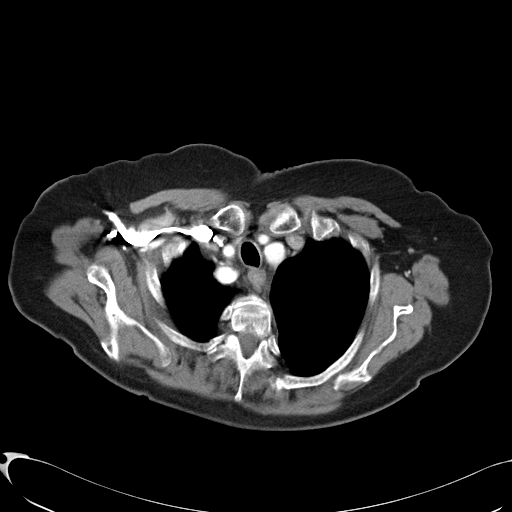

[Series 602: <mpr thick range> · coronal · 1.07mm/px · 3 of 92 slices shown]
[im 31/92  soft-tissue]
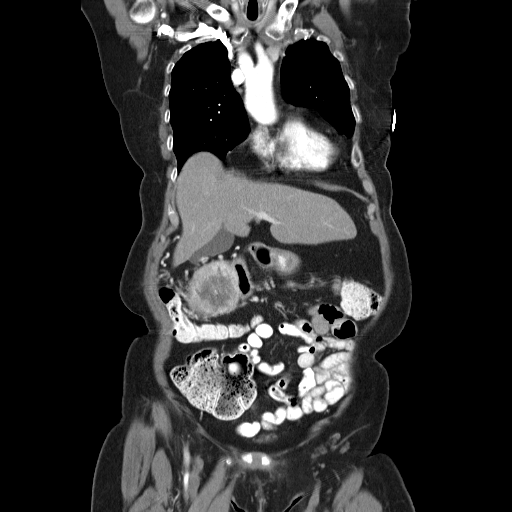
[im 41/92  soft-tissue]
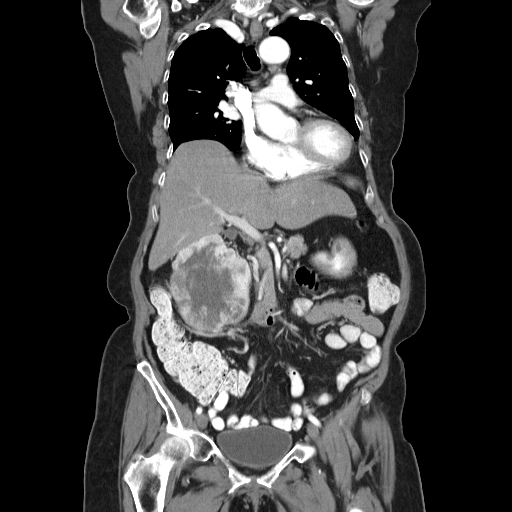
[im 51/92  soft-tissue]
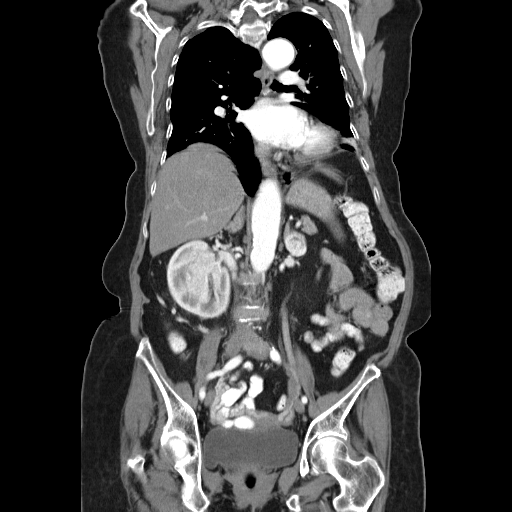

[16 of 46 positions shown; findings below may reference images not displayed]

FINDINGS: There is no axillary lymphadenopathy.  Small mediastinal
lymph nodes are evident, but there is no mediastinal or hilar
lymphadenopathy.  The heart is borderline enlarged.  No pericardial
or pleural effusion.

Multiple bilateral pulmonary nodules are evident.  One of the
larger nodules is seen in the lingula and measures 7 mm in
diameter.  This same nodule was about 7 mm on the previous study.
A 9 mm dominant nodule in the central left lower lobe was 9 mm
previously.

Bone windows show no worrisome lytic or sclerotic osseous
abnormality in the thorax.
IMPRESSION: Stable appearance of bilateral numerous pulmonary nodules
consistent with metastatic disease.

CT ABDOMEN AND PELVIS
FINDINGS: The liver, spleen, stomach, duodenum, pancreas,
gallbladder, and right adrenal gland are unremarkable.  2.2 x
cm heterogeneously enhancing nodule in the left thyroid lobe is
stable.

The large complex enhancing right renal mass with central necrosis
measures 9.3 x 7.7 cm today at the same level where it was measured
at 9.1 x 7.8 cm previously.  This mass involves the interpolar
region and lower pole.  The left renal vein appears patent.  No
evidence for a filling defect in the infrahepatic suprarenal IVC to
suggest tumor extension. Renal perfusion and excretion is
symmetric.  There is attenuation of the proximal right ureter
secondary to mass effect by the lesion, but there is no substantial
ureteral obstruction.

Imaging through the pelvis shows no free intraperitoneal fluid.
There is no pelvic sidewall lymphadenopathy.  The bladder is
normal.  Uterus is unremarkable.  No adnexal mass.  No substantial
diverticular disease in the colon.  There is no colonic
diverticulitis.  Terminal ileum is normal. The appendix is not
visualized, but there is no edema or inflammation in the region of
the cecum.

Bone windows reveal no worrisome lytic or sclerotic osseous
lesions.
IMPRESSION: No substantial change in the appearance of the large necrotic right
renal mass consistent with renal cell carcinoma.

Stable left adrenal metastasis.
